# Patient Record
Sex: Female | Born: 1940
Health system: Southern US, Community
[De-identification: ages and names within clinical notes are randomized; demographics above are authoritative.]

## PROBLEM LIST (undated history)

## (undated) DIAGNOSIS — I10 Essential (primary) hypertension: Secondary | ICD-10-CM

## (undated) DIAGNOSIS — K219 Gastro-esophageal reflux disease without esophagitis: Secondary | ICD-10-CM

## (undated) DIAGNOSIS — R002 Palpitations: Secondary | ICD-10-CM

## (undated) DIAGNOSIS — J449 Chronic obstructive pulmonary disease, unspecified: Secondary | ICD-10-CM

## (undated) DIAGNOSIS — I447 Left bundle-branch block, unspecified: Secondary | ICD-10-CM

## (undated) HISTORY — DX: Left bundle-branch block, unspecified: I44.7

## (undated) HISTORY — DX: Chronic obstructive pulmonary disease, unspecified: J44.9

## (undated) HISTORY — DX: Gastro-esophageal reflux disease without esophagitis: K21.9

## (undated) HISTORY — PX: CERVICAL SPINE SURGERY: SHX589

## (undated) HISTORY — PX: ABDOMINAL HYSTERECTOMY: SHX81

## (undated) HISTORY — PX: CHOLECYSTECTOMY: SHX55

## (undated) HISTORY — DX: Palpitations: R00.2

## (undated) HISTORY — DX: Essential (primary) hypertension: I10

## (undated) HISTORY — PX: CATARACT EXTRACTION: SUR2

---

## 2008-07-31 ENCOUNTER — Inpatient Hospital Stay (HOSPITAL_COMMUNITY): Admission: RE | Admit: 2008-07-31 | Discharge: 2008-08-01 | Payer: Self-pay | Admitting: Neurosurgery

## 2011-04-14 NOTE — Op Note (Signed)
NAMESHIRLEYANN, Dorothy Bryant                  ACCOUNT NO.:  [id number]   MEDICAL RECORD NO.:  [id number]          PATIENT TYPE:  INP   LOCATION:  3537                         FACILITY:  [facility]   PHYSICIAN:  Danae Orleans. Venetia Maxon, M.D.  DATE OF BIRTH:  08-04-1941   DATE OF PROCEDURE:  07/31/2008  DATE OF DISCHARGE:                               OPERATIVE REPORT   PREOPERATIVE DIAGNOSES:  1. Herniated cervical disk with spondylosis.  2. Degenerative disk disease.  3. Cervical radiculopathy at C6-7.   POSTOPERATIVE DIAGNOSES:  1. Herniated cervical disk with spondylosis.  2. Degenerative disk disease.  3. Cervical radiculopathy at C6-7.   PROCEDURES:  Anterior cervical decompression and fusion C6-7 with  allograft bone wedge and morselized bone autograft in the anterior  cervical plate.   SURGEON:  Danae Orleans. Venetia Maxon, MD   ASSISTANTS:  1. Georgiann Cocker, RN  2. Cristi Loron, MD   ANESTHESIA:  General endotracheal.   ESTIMATED BLOOD LOSS:  Minimal.   COMPLICATIONS:  None.   DISPOSITION:  Recovery.   INDICATIONS:  Dorothy Bryant is a 70 year old woman with severe bilateral  upper extremity, right greater than left pain with significant  spondylitic foraminal stenosis at C6-7.  It was elected to take her to  surgery for anterior cervical decompression and fusion at this affected  level.   PROCEDURE:  Dorothy Bryant was brought to the operating room.  Following  satisfactory and uncomplicated induction of general endotracheal  anesthesia and placement of intravenous lines, the patient was placed in  a supine position on the operating table.  Her neck was placed in slight  extension.  She was placed in 5 pounds of Halter traction.  Her anterior  neck was then prepped and draped in usual sterile fashion.  Area of  planned incision was infiltrated with 0.25% Marcaine and 0.5% lidocaine  with 1:200,000 epinephrine.  Incision was made from the midline to the  anterior border of sternocleidomastoid  muscle on the left side of  midline just below the carotid tubercle.  Incision was carried sharply  through the platysmal layer.  Subplatysmal dissection was performed  exposing the anterior border of sternocleidomastoid muscle using blunt  dissection.  The carotid sheath was kept lateral, trachea and esophagus  kept medial exposing the anterior cervical spine.  The bent spinal  needle was placed at C5-6 and C6-7 levels and this was confirmed on  intraoperative x-ray.  Subsequently, the longus colli muscles were taken  down from the anterior cervical spine using electrocautery and Key  elevator and still retained input.  Shadow-Line retractors were placed  at the C6-7 level.  Interspace was incised and disk material was  removed.  Disk was highly degenerated.  Interspace was cleared of  residual disk material and osteophytes were removed and saved for later  use with bone grafting.  Subsequently, the distraction pins were placed  at C6 and C7 using general distraction and interspace was opened.  The  microscope was brought in the field and under high-power microscopic  visualization, the end plates were decorticated with high-speed drill  and large uncinate spurs were drilled down.  The right C7 nerve was  decompressed widely, as was the central spinal cord dura and  subsequently the left C7 nerve root was also decompressed.  Hemostasis  was assured and after trial sizing, a 7 mm allograft bone wedge was  selected, packed with morcellized bone autograft preserved from the  drilling of the endplates and from osteophyte removal, and inserted in  the interspace and countersunk appropriately.  A 14-mm Trestle anterior  cervical plate was fixed in the anterior cervical spine with 40-mm  variable angle screws two at C6 and two at C7.  All screws had excellent  purchase.  Interlocking mechanisms were engaged.  Prior to placing the  anterior plate, the traction weight was removed.  Hemostasis  was  assured.  Soft tissues were inspected and found to be in good repair.  Final x-ray demonstrated the superior aspect of the constructed C6  level.  The platysmal layer was closed with 3-0 Vicryl sutures and the  skin edges were approximated with 3-0 Vicryl subcuticular stitches, and  the wound was dressed with Dermabond.  The patient was extubated in the  operating room and taken to the recovery in stable and satisfactory  condition, having tolerated the operation well.  Counts were correct at  the end of the case.      Danae Orleans. Venetia Maxon, M.D.  Electronically Signed     JDS/MEDQ  D:  07/31/2008  T:  08/01/2008  Job:  [id number]

## 2011-06-02 ENCOUNTER — Other Ambulatory Visit (HOSPITAL_COMMUNITY): Payer: Self-pay | Admitting: Pulmonary Disease

## 2011-06-02 ENCOUNTER — Ambulatory Visit (HOSPITAL_COMMUNITY)
Admission: RE | Admit: 2011-06-02 | Discharge: 2011-06-02 | Disposition: A | Discharge status: Home / Self Care | Payer: Medicare Other | Source: Ambulatory Visit | Attending: Pulmonary Disease | Admitting: Pulmonary Disease

## 2011-06-02 DIAGNOSIS — R05 Cough: Secondary | ICD-10-CM

## 2011-06-02 DIAGNOSIS — R0609 Other forms of dyspnea: Secondary | ICD-10-CM | POA: Insufficient documentation

## 2011-06-02 DIAGNOSIS — R0989 Other specified symptoms and signs involving the circulatory and respiratory systems: Secondary | ICD-10-CM | POA: Insufficient documentation

## 2011-06-02 DIAGNOSIS — R059 Cough, unspecified: Secondary | ICD-10-CM | POA: Insufficient documentation

## 2011-06-02 LAB — BLOOD GAS, ARTERIAL
Acid-Base Excess: 2 mmol/L (ref 0.0–2.0)
FIO2: 0.21 %
O2 Saturation: 95.9 %
Patient temperature: 37

## 2011-06-27 ENCOUNTER — Inpatient Hospital Stay (HOSPITAL_COMMUNITY)
Admission: AD | Admit: 2011-06-27 | Discharge: 2011-06-30 | DRG: 287 | Disposition: A | Discharge status: Home / Self Care | Payer: Medicare Other | Source: Other Acute Inpatient Hospital | Attending: Cardiovascular Disease | Admitting: Cardiovascular Disease

## 2011-06-27 DIAGNOSIS — J4489 Other specified chronic obstructive pulmonary disease: Secondary | ICD-10-CM | POA: Diagnosis present

## 2011-06-27 DIAGNOSIS — R002 Palpitations: Secondary | ICD-10-CM

## 2011-06-27 DIAGNOSIS — I447 Left bundle-branch block, unspecified: Secondary | ICD-10-CM | POA: Diagnosis present

## 2011-06-27 DIAGNOSIS — Z87891 Personal history of nicotine dependence: Secondary | ICD-10-CM

## 2011-06-27 DIAGNOSIS — K219 Gastro-esophageal reflux disease without esophagitis: Secondary | ICD-10-CM | POA: Diagnosis present

## 2011-06-27 DIAGNOSIS — Z8249 Family history of ischemic heart disease and other diseases of the circulatory system: Secondary | ICD-10-CM

## 2011-06-27 DIAGNOSIS — J449 Chronic obstructive pulmonary disease, unspecified: Secondary | ICD-10-CM | POA: Diagnosis present

## 2011-06-27 DIAGNOSIS — Z79899 Other long term (current) drug therapy: Secondary | ICD-10-CM

## 2011-06-27 DIAGNOSIS — Z23 Encounter for immunization: Secondary | ICD-10-CM

## 2011-06-27 DIAGNOSIS — R0789 Other chest pain: Principal | ICD-10-CM | POA: Diagnosis present

## 2011-06-27 DIAGNOSIS — I517 Cardiomegaly: Secondary | ICD-10-CM

## 2011-06-27 DIAGNOSIS — I10 Essential (primary) hypertension: Secondary | ICD-10-CM | POA: Diagnosis present

## 2011-06-27 LAB — BASIC METABOLIC PANEL WITH GFR
BUN: 16 mg/dL (ref 6–23)
CO2: 27 meq/L (ref 19–32)
Calcium: 9 mg/dL (ref 8.4–10.5)
Chloride: 103 meq/L (ref 96–112)
Creatinine, Ser: 0.93 mg/dL (ref 0.50–1.10)
GFR calc Af Amer: >60 mL/min
GFR calc non Af Amer: 60 mL/min — ABNORMAL LOW
Glucose, Bld: 96 mg/dL (ref 70–99)
Potassium: 4.7 meq/L (ref 3.5–5.1)
Sodium: 141 meq/L (ref 135–145)

## 2011-06-27 LAB — URINE MICROSCOPIC-ADD ON

## 2011-06-27 LAB — URINALYSIS, ROUTINE W REFLEX MICROSCOPIC
Bilirubin Urine: NEGATIVE
Glucose, UA: NEGATIVE mg/dL
Hgb urine dipstick: NEGATIVE
Protein, ur: NEGATIVE mg/dL

## 2011-06-27 LAB — CARDIAC PANEL(CRET KIN+CKTOT+MB+TROPI)
CK, MB: 10.6 ng/mL (ref 0.3–4.0)
CK, MB: 4.3 ng/mL — ABNORMAL HIGH (ref 0.3–4.0)
CK, MB: 3.8 ng/mL (ref 0.3–4.0)
Relative Index: 2.4 (ref 0.0–2.5)
Relative Index: 2.3 (ref 0.0–2.5)
Total CK: 538 U/L — ABNORMAL HIGH (ref 7–177)
Total CK: 177 U/L (ref 7–177)
Total CK: 165 U/L (ref 7–177)
Troponin I: <0.3 ng/mL
Troponin I: <0.3 ng/mL

## 2011-06-27 LAB — MAGNESIUM: Magnesium: 2.2 mg/dL (ref 1.5–2.5)

## 2011-06-27 LAB — TSH: TSH: 5.528 u[IU]/mL — ABNORMAL HIGH (ref 0.350–4.500)

## 2011-06-28 DIAGNOSIS — I10 Essential (primary) hypertension: Secondary | ICD-10-CM

## 2011-06-28 DIAGNOSIS — R079 Chest pain, unspecified: Secondary | ICD-10-CM

## 2011-06-29 LAB — CBC
MCH: 28.8 pg (ref 26.0–34.0)
MCHC: 32.4 g/dL (ref 30.0–36.0)
Platelets: 259 10*3/uL (ref 150–400)
RDW: 14.2 % (ref 11.5–15.5)

## 2011-06-29 LAB — BASIC METABOLIC PANEL
BUN: 19 mg/dL (ref 6–23)
Creatinine, Ser: 0.86 mg/dL (ref 0.50–1.10)
GFR calc Af Amer: >60 mL/min (ref >60)
GFR calc non Af Amer: >60 mL/min (ref >60)

## 2011-06-29 LAB — PROTIME-INR: Prothrombin Time: 12.7 seconds (ref 11.6–15.2)

## 2011-06-30 HISTORY — PX: CARDIAC CATHETERIZATION: SHX172

## 2011-07-07 ENCOUNTER — Encounter (INDEPENDENT_AMBULATORY_CARE_PROVIDER_SITE_OTHER): Payer: Medicare Other

## 2011-07-07 DIAGNOSIS — R002 Palpitations: Secondary | ICD-10-CM

## 2011-07-09 ENCOUNTER — Encounter: Payer: Medicare Other | Admitting: Physician Assistant

## 2011-07-15 NOTE — Cardiovascular Report (Signed)
  [name], Dorothy Bryant NO.:  [id number]  MEDICAL RECORD NO.:  [id number]  LOCATION:  3701                         FACILITY:  [facility]  PHYSICIAN:  Vesta Mixer, M.D. DATE OF BIRTH:  06-Jul-1941  DATE OF PROCEDURE:  06/29/2011 DATE OF DISCHARGE:                           CARDIAC CATHETERIZATION   Dorothy Bryant is a 70 year old female with a history of chest pain.  She is admitted the other day.  She has an intermittent left bundle-branch block.  She was referred for cardiac catheterization for further evaluation.  PROCEDURES:  Left heart catheterization with coronary angiography.  The right femoral artery was easily cannulated using a modified Seldinger technique.  Angiography of the femoral artery revealed that the stick is just above the bifurcation and is not suitable for Angio-Seal.  HEMODYNAMICS:  The left ventricular apex is 161/15.  Left ventricular pace is 147/10.  The aortic blood pressure is 153/62.  ANGIOGRAPHY:  Left main.  The left main is fairly short.  The Judkins left 4 catheter selectively engaged to left circumflex artery.  It was then pulled back and injections were made of the left anterior descending artery.  The left main is smooth and normal.  The left anterior descending artery is smooth and normal.  There is a large diagonal branch which is normal.  The left circumflex artery is a normal vessel.  The first and second obtuse marginal arteries are fairly small, but are normal.  The right coronary artery is smooth and normal.  The posterior descending artery and the posterolateral segment arteries are normal.  The left ventriculogram was performed in a 30 RAO position.  It reveals normal left ventricular systolic function with an ejection fraction of around 60%-65%.  During the injection, we were able to visualize a mid left ventricular outflow track obstruction.  Following the case, the Judkins right 4 catheter was placed down  into the ventricle and we did a pullback from the apex back to the base.  We are able to demonstrate a grade of around 10 mmHg.  I could not tell whether the abnormal left ventricular contraction pattern was due to the catheter and/or dye injection, but it appears to be real.  Further assessment can be made by echo.  COMPLICATIONS:  None.  CONCLUSION: 1. Smooth and normal coronary arteries. 2. Normal left ventricular systolic function.  She appears to have a     mild left ventricular outflow track obstruction.  We may want to     try treating her with some low-dose beta-blocker.  The patient received Lovenox 40 mg subcu at 10:30 a.m.  I discussed that with the pharmacist.  They recommended sheath pull at 5 p.m. tonight. We will await and pull at 5 p.m. and she will have a 6-hour bedrest.  We will be able to discharge her tomorrow.     Vesta Mixer, M.D.     PJN/MEDQ  D:  06/29/2011  T:  06/30/2011  Job:  [id number]  cc:   Noralyn Pick. Eden Emms, MD, [facility]  Electronically Signed by Kristeen Miss M.D. on 07/15/2011 05:35:42 PM

## 2011-07-22 ENCOUNTER — Encounter: Payer: Self-pay | Admitting: Physician Assistant

## 2011-07-23 ENCOUNTER — Ambulatory Visit (INDEPENDENT_AMBULATORY_CARE_PROVIDER_SITE_OTHER): Payer: Medicare Other | Admitting: Physician Assistant

## 2011-07-23 ENCOUNTER — Encounter: Payer: Self-pay | Admitting: Physician Assistant

## 2011-07-23 VITALS — BP 144/66 | HR 69 | Ht 60.0 in | Wt 147.0 lb

## 2011-07-23 DIAGNOSIS — R7989 Other specified abnormal findings of blood chemistry: Secondary | ICD-10-CM | POA: Insufficient documentation

## 2011-07-23 DIAGNOSIS — R079 Chest pain, unspecified: Secondary | ICD-10-CM | POA: Insufficient documentation

## 2011-07-23 DIAGNOSIS — R002 Palpitations: Secondary | ICD-10-CM

## 2011-07-23 DIAGNOSIS — R6889 Other general symptoms and signs: Secondary | ICD-10-CM

## 2011-07-23 DIAGNOSIS — I10 Essential (primary) hypertension: Secondary | ICD-10-CM

## 2011-07-23 NOTE — Assessment & Plan Note (Signed)
She sees her primary care provider soon.  I have recommended that she have her TSH repeated with him.

## 2011-07-23 NOTE — Discharge Summary (Signed)
NAMEMIKELL, CAMP NO.:  [id number]  MEDICAL RECORD NO.:  [id number]  LOCATION:  3701                         FACILITY:  [facility]  PHYSICIAN:  Hillis Range, MD       DATE OF BIRTH:  [birth year]  DATE OF ADMISSION:  06/27/2011 DATE OF DISCHARGE:  06/30/2011                              DISCHARGE SUMMARY   PRIMARY CARDIOLOGIST:  Hillis Range, MD  DISCHARGE DIAGNOSIS:  Chest pain without objective evidence of ischemia.  SECONDARY DIAGNOSES: 1. Normal coronary arteries by catheterization this admission. 2. Chronic obstructive pulmonary disease. 3. Hypertension. 4. Gastroesophageal reflux disease. 5. Palpitations. 6. Remote tobacco abuse. 7. Left ventricular outflow tract gradient of approximately 40 mmHg. 8. Intermittent left bundle-branch block.  ALLERGIES:  No known drug allergies.  PROCEDURES: 1. Left heart catheterization performed on June 29, 2011 showing     normal coronary arteries and an EF of 60-65%.  There was a mid left     ventricular outflow tract obstruction with a gradient of 14 mmHg. 2. 2-D echocardiogram, June 27, 2011:  EF 55-60% without regional wall     motion abnormalities.  Grade 2 diastolic dysfunction.  Mild LVH.     Normal RV systolic function.  PASP 29 mmHg.  HISTORY OF PRESENT ILLNESS:  This is a 70 year old female without prior history of coronary artery disease who presented to [facility] ED on June 27, 2011 following an episode of substernal chest discomfort associated with mild dizziness and tachy palpitations that occurred after getting out of the shower.  Her blood pressure cuff at home showed a heart rate of 170 beats per minute with a systolic pressure of 200 which prompted her ER visit.  In the ED, she was in sinus rhythm without any acute ST-T changes and point-of-care cardiac markers were negative. She was admitted for evaluation.  HOSPITAL COURSE:  On admission, the patient reported that she had a similar episode  that occurred earlier this year for which she was evaluated at [facility] in [place] with a stress test that was apparently negative.  Secondary to recurrence of symptoms, a decision was made to pursue left heart cardiac catheterization which was performed on June 29, 2011 showing normal coronary arteries.  It was noted on ventriculography that the patient had a mid left ventricular outflow tract obstruction.  Pressure measurement of this area showed a 14 mm gradient.  She was placed on beta-blocker therapy post catheterization and this has since been titrated.  She has had no recurrence of palpitations or tachyarrhythmias on the monitor.  Of note, the patient has been noted to have intermittent left bundle-branch block which was present on admission.  We plan to discharge her home today in good condition on Toprol therapy with plan for a 21-day monitor and follow up in our office in approximately 3 weeks.  DISCHARGE LABORATORY DATA:  Hemoglobin 11.3, hematocrit 34.9, WBC 8.7, and platelets 259.  INR 0.93.  Sodium 140, potassium 3.9, chloride 103, CO2 30, BUN 19, creatinine 0.86, glucose 104, calcium 9.0, and magnesium 2.2.  CK 165, MB 3.8, and troponin-I less 0.30.  TSH 5.528.  DISPOSITION:  The patient will  be discharged home today in good condition.  FOLLOWUP PLANS AND APPOINTMENTS:  We will contact the patient within the next few days to arrange for a 21-day event monitor.  The patient will follow up with Tereso Newcomer, PA on July 23, 2011 at 12:00 p.m.  DISCHARGE MEDICATIONS: 1. Aspirin 81 mg daily. 2. Toprol-XL 25 mg daily. 3. Aleve 220 mg 2 tablets daily p.r.n. 4. Benicar HCT 20/12.5 mg daily. 5. Ipratropium nebulizer 0.5 mg b.i.d. 6. Pepcid OTC 1 tablet daily p.r.n. 7. Symbicort 160/4.5 mcg 2 puffs b.i.d.  OUTSTANDING LABORATORY STUDIES:  Follow up 21-day event monitor.  DURATION OF DISCHARGE ENCOUNTER:  40 minutes including physician time.     Nicolasa Ducking,  ANP   ______________________________ Hillis Range, MD    CB/MEDQ  D:  06/30/2011  T:  07/01/2011  Job:  [id number]  Electronically Signed by Nicolasa Ducking ANP on 07/10/2011 03:31:08 PM Electronically Signed by Hillis Range MD on 07/23/2011 09:42:45 AM

## 2011-07-23 NOTE — Progress Notes (Signed)
History of Present Illness: Primary Electrophysiologist:  Dr. Hillis Range  PCP: Dr. Fara Chute  Dorothy Bryant is a 70 y.o. female who presents for post hospital follow up.  She has a history of COPD, hypertension and GERD.  She presented to [facility] 7/28-7/31 with complaints of chest pain, dizziness and palpitations.  She recorded her heart rate at 170 on her blood pressure machine.  She has had a couple similar episodes earlier this year.  For one episode, she was treated at [facility].  She had a negative stress test.  At [facility], cardiac enzymes were negative.  She had intermittent left bundle branch block on her monitor.  She underwent cardiac catheterization 7/31 that demonstrated normal coronary arteries and normal LV function.  There was a question of whether or not she had a mid LVOT obstruction at 10 mm of mercury.  Echocardiogram demonstrated mild LVH, EF 55-60% and grade 2 diastolic dysfunction.  She was placed on a beta blocker.  Palpation in that monitor was arranged.  I reviewed the strips today.  So far, this has demonstrated normal sinus rhythm.  Labs: Hemoglobin 11.3, potassium 3.9, creatinine 0.86, TSH 5.528.  Chest x-ray demonstrated emphysematous changes without acute changes.  The patient denies chest pain, syncope, orthopnea, PND or significant pedal edema.  She has chronic dyspnea with exertion related to her COPD.  She describes class II-IIb symptoms.  She has not had any further palpitations.  Past Medical History  Diagnosis Date  . Chest pain     Cardiac catheterization 7/12: Normal coronary arteries, EF 60-65%, question mid LVOT obstruction with a 10 mm mercury gradient;   echo 7/12: Mild LVH, EF 55-60%, grade 2 diastolic dysfunction, mild LAE, PAS P. 29  . COPD (chronic obstructive pulmonary disease)   . Hypertension   . GERD (gastroesophageal reflux disease)   . Heart palpitations     Event monitor 8/12  . LBBB (left bundle branch  block)     Current Outpatient Prescriptions  Medication Sig Dispense Refill  . albuterol (PROVENTIL) (2.5 MG/3ML) 0.083% nebulizer solution Take 2.5 mg by nebulization 2 (two) times daily.        Marland Kitchen ALPRAZolam (XANAX) 0.5 MG tablet Take 0.5 mg by mouth at bedtime as needed.        Marland Kitchen aspirin 81 MG tablet Take 81 mg by mouth daily.        . budesonide-formoterol (SYMBICORT) 160-4.5 MCG/ACT inhaler Inhale 2 puffs into the lungs 2 (two) times daily.        . citalopram (CELEXA) 10 MG tablet Take 10 mg by mouth daily.        . famotidine (PEPCID) 10 MG tablet Take 10 mg by mouth at bedtime as needed.        . IPRATROPIUM BROMIDE IN Inhale into the lungs as directed.        Marland Kitchen losartan-hydrochlorothiazide (HYZAAR) 100-12.5 MG per tablet Take 1 tablet by mouth daily.        . metoprolol succinate (TOPROL-XL) 25 MG 24 hr tablet Take 25 mg by mouth daily.        . naproxen sodium (ANAPROX) 220 MG tablet Take 220 mg by mouth as needed.          Allergies: No Known Allergies  Vital Signs: BP 144/66  Pulse 69  Ht 5' (1.524 m)  Wt 147 lb (66.679 kg)  BMI 28.71 kg/m2  PHYSICAL EXAM: Well nourished, well developed, in  no acute distress HEENT: normal Neck: no JVD Cardiac:  normal S1, S2; RRR; no murmur Lungs:  Decreased breath sounds bilaterally, no wheezing, rhonchi or rales Abd: soft, nontender, no hepatomegaly Ext: no edema; RFA site without hematoma or bruit Skin: warm and dry Neuro:  CNs 2-12 intact, no focal abnormalities noted  EKG:  Sinus rhythm, heart rate 69, left bundle branch block  ASSESSMENT AND PLAN:

## 2011-07-23 NOTE — Assessment & Plan Note (Signed)
Continue beta blocker.  She will continue to wear her monitor until it is completed.  As noted, thus far, she has just demonstrated normal sinus rhythm.  Followup with Dr. Johney Frame in 2 months.

## 2011-07-23 NOTE — Assessment & Plan Note (Signed)
Controlled.  Continue current therapy.  

## 2011-07-23 NOTE — Assessment & Plan Note (Signed)
No recurrence.  As noted, she had normal coronary arteries on cardiac catheterization.

## 2011-07-23 NOTE — Patient Instructions (Signed)
Your physician recommends that you schedule a follow-up appointment in: 2 months with Dr. Johney Frame as per Tereso Newcomer, PA-C  Please have your thyroid re-checked when you see your Primary Care Physician as per Tereso Newcomer, PA-C

## 2011-07-27 ENCOUNTER — Other Ambulatory Visit: Payer: Self-pay | Admitting: Cardiovascular Disease

## 2011-07-27 MED ORDER — METOPROLOL SUCCINATE ER 25 MG PO TB24: 25.0000 mg | ORAL_TABLET | Freq: Every day | ORAL | Status: DC

## 2011-08-23 NOTE — H&P (Signed)
NAMEMARYCATHERINE, Dorothy Bryant NO.:  [id number]  MEDICAL RECORD NO.:  [id number]  LOCATION:  3701                         FACILITY:  [facility]  PHYSICIAN:  Henderson Cloud, MD     DATE OF BIRTH:  06-25-1941  DATE OF ADMISSION:  06/27/2011 DATE OF DISCHARGE:                             HISTORY & PHYSICAL   CHIEF COMPLAINT:  Palpitations.  HISTORY OF PRESENT ILLNESS:  The patient is a 70 year old white female with a past medical history significant for COPD, hypertension, GERD who is presenting with palpitations and chest discomfort after getting out of the shower today.  The patient states that she was in her normal state of health until she exited the shower.  At that time, she felt substernal chest discomfort, some mild dizziness, and sensation that her heart was racing.  Her automated blood pressure monitoring device indicated her heart rate was greater than 170 beats per minute and her systolic blood pressure was greater than 200.  The patient states she had a similar episode in May of this year, also after exiting the shower.  At that time, she was worked up at [facility] with a stress test which per the family's report was negative.  These are the only 2 such episodes that she has experienced.  The patient's symptoms lasted for a couple of hours until she arrived in the emergency room.  Per the ED report, her vital signs were stable upon initial evaluation.  PAST MEDICAL HISTORY:  As above in the HPI.  SOCIAL HISTORY:  Has a history of tobacco, but quit 3 years ago.  No alcohol.  FAMILY HISTORY:  Positive for coronary artery disease but not premature coronary artery disease.  ALLERGIES:  No known drug allergies.  MEDICATIONS: 1. Symbicort 160/4.5 daily. 2. Combivent p.r.n. 3. Benicar HCT 20/12.5 mg daily. 4. Pepcid 20 mg daily.  REVIEW OF SYSTEMS:  As in HPI.  All other systems were reviewed and are negative.  PHYSICAL EXAMINATION:  VITAL SIGNS:  The  patient is afebrile, blood pressure is 143/73, pulse is 88, satting 95% on 2 L. GENERAL:  No acute stress. HEENT: Normocephalic, atraumatic. NECK:  Supple.  There is no JVD.  No carotid bruits. HEART:  Regular rate and rhythm without murmur or gallop. LUNGS:  Clear bilaterally. ABDOMEN:  Soft, nontender, nondistended. EXTREMITIES:  Without edema. SKIN:  Warm and dry. PSYCHIATRIC:  The patient is appropriate. MUSCULOSKELETAL:  5/5 bilateral upper and lower extremity strength. NEURO:  Grossly nonfocal.  LABORATORY DATA:  Sodium 141, potassium 3.4, chloride 104, CO2 30, BUN 17, creatinine 0.97, glucose 90, white count 11, hemoglobin 13, hematocrit 34, platelet count 301, D-dimer is 0.37, CK 273, CK-MB 4.0, troponin less than 0.01.  EKG; normal sinus rhythm and left bundle- branch block, poor R-wave progression.  ASSESSMENT:  The patient's symptoms are most consistent with a tachyarrhythmia.  It is encouraging that she had a negative stress test at [facility] in May 2012, although the official report along with prior EKG would be helpful.  If this is a tachyarrhythmia, EAT certainly would be likely considering her history of chronic obstructive pulmonary disease.  PLAN:  The  patient is admitted to telemetry and will be ruled out for myocardial infarction.  We will check a TSH and a UA.  Her potassium will be replaced.  We will check a transthoracic echocardiogram to assure normal heart structure and function.  I will start her on Cardizem 180 mg daily for improved blood pressure control and potential improvements in any tachyarrhythmias she may be experiencing.  If her current EKG abnormalities are old, there may be no need for further ischemia evaluation if she had one earlier this year.  However, if the poor R-wave progression and the intraventricular conduction delay is new, a left heart catheterization could be considered for definitive evaluation of the coronary anatomy.  Other home  medications will be continued.     Henderson Cloud, MD     SGA/MEDQ  D:  06/27/2011  T:  06/27/2011  Job:  [id number]  Electronically Signed by Raynelle Bring MD on 08/23/2011 09:54:40 AM

## 2011-09-18 ENCOUNTER — Encounter: Payer: Self-pay | Admitting: *Deleted

## 2011-09-23 ENCOUNTER — Ambulatory Visit (INDEPENDENT_AMBULATORY_CARE_PROVIDER_SITE_OTHER): Payer: Medicare Other | Admitting: Internal Medicine

## 2011-09-23 ENCOUNTER — Encounter: Payer: Self-pay | Admitting: Internal Medicine

## 2011-09-23 DIAGNOSIS — R079 Chest pain, unspecified: Secondary | ICD-10-CM

## 2011-09-23 DIAGNOSIS — I10 Essential (primary) hypertension: Secondary | ICD-10-CM

## 2011-09-23 DIAGNOSIS — R002 Palpitations: Secondary | ICD-10-CM

## 2011-09-23 NOTE — Progress Notes (Signed)
The patient presents today for routine electrophysiology followup.  Since last being seen in our clinic, the patient reports doing very well.  She remains active, without further difficulty.  She has stable SOB with moderate activity.  Her palpitations have resolved  Today, she denies symptoms of chest pain,orthopnea, PND, lower extremity edema, dizziness, presyncope, syncope, or neurologic sequela.  The patient feels that she is tolerating medications without difficulties and is otherwise without complaint today.   Past Medical History  Diagnosis Date  . Chest pain     Cardiac catheterization 7/12: Normal coronary arteries, EF 60-65%, question mid LVOT obstruction with a 10 mm mercury gradient;   echo 7/12: Mild LVH, EF 55-60%, grade 2 diastolic dysfunction, mild LAE, PAS P. 29  . COPD (chronic obstructive pulmonary disease)   . Hypertension   . GERD (gastroesophageal reflux disease)   . Heart palpitations     Event monitor 8/12  . LBBB (left bundle branch block)    Past Surgical History  Procedure Date  . Cardiac catheterization 06/30/2011    Est. EF of 60-65% with normal coronary arteries --     Current Outpatient Prescriptions  Medication Sig Dispense Refill  . albuterol (PROVENTIL) (2.5 MG/3ML) 0.083% nebulizer solution Take 2.5 mg by nebulization 2 (two) times daily.        Marland Kitchen ALPRAZolam (XANAX) 0.5 MG tablet Take 0.5 mg by mouth at bedtime as needed.        Marland Kitchen aspirin 81 MG tablet Take 81 mg by mouth daily.        . budesonide-formoterol (SYMBICORT) 160-4.5 MCG/ACT inhaler Inhale 2 puffs into the lungs 2 (two) times daily.        . citalopram (CELEXA) 10 MG tablet Take 10 mg by mouth daily.        . famotidine (PEPCID) 10 MG tablet Take 10 mg by mouth at bedtime as needed.        Marland Kitchen losartan-hydrochlorothiazide (HYZAAR) 100-12.5 MG per tablet Take 1 tablet by mouth daily.        . metoprolol succinate (TOPROL-XL) 25 MG 24 hr tablet Take 1 tablet (25 mg total) by mouth daily.  30  tablet  12  . tiotropium (SPIRIVA) 18 MCG inhalation capsule Place 18 mcg into inhaler and inhale daily.          No Known Allergies  History   Social History  . Marital Status: Divorced    Spouse Name: N/A    Number of Children: N/A  . Years of Education: N/A   Occupational History  . Not on file.   Social History Main Topics  . Smoking status: Former Smoker -- 1.0 packs/day for 50 years    Types: Cigarettes    Quit date: 09/17/2008  . Smokeless tobacco: Not on file  . Alcohol Use: No  . Drug Use: Not on file  . Sexually Active: Not on file   Other Topics Concern  . Not on file   Social History Narrative  . No narrative on file    Family History  Problem Relation Age of Onset  . Acute lymphoblastic leukemia Maternal Grandfather   . Coronary artery disease      Positive Family History  . Heart disease      Positive Family History   Physical Exam: Filed Vitals:   09/23/11 1353  BP: 130/80  Pulse: 71  Height: 5' (1.524 m)  Weight: 145 lb 1.9 oz (65.826 kg)    GEN- The patient is well  appearing, alert and oriented x 3 today.   Head- normocephalic, atraumatic Eyes-  Sclera clear, conjunctiva pink Ears- hearing intact Oropharynx- clear Neck- supple,  Lungs- Clear to ausculation bilaterally, normal work of breathing Heart- Regular rate and rhythm, no murmurs, rubs or gallops, PMI not laterally displaced GI- soft, NT, ND, + BS Extremities- no clubbing, cyanosis, or edema   Assessment and Plan:

## 2011-09-23 NOTE — Patient Instructions (Signed)
Your physician recommends that you schedule a follow-up appointment as needed  

## 2011-09-23 NOTE — Assessment & Plan Note (Signed)
Recent event monitor reveals no arrhythmias Symptoms have resolved Continue low dose toprol

## 2011-09-23 NOTE — Assessment & Plan Note (Signed)
Stable No change required today  

## 2011-09-23 NOTE — Assessment & Plan Note (Signed)
Resolved Recent normal cath

## 2012-03-23 DIAGNOSIS — J449 Chronic obstructive pulmonary disease, unspecified: Secondary | ICD-10-CM | POA: Diagnosis not present

## 2012-03-23 DIAGNOSIS — I10 Essential (primary) hypertension: Secondary | ICD-10-CM | POA: Diagnosis not present

## 2012-04-19 DIAGNOSIS — I10 Essential (primary) hypertension: Secondary | ICD-10-CM | POA: Diagnosis not present

## 2012-04-26 DIAGNOSIS — I10 Essential (primary) hypertension: Secondary | ICD-10-CM | POA: Diagnosis not present

## 2012-04-26 DIAGNOSIS — F411 Generalized anxiety disorder: Secondary | ICD-10-CM | POA: Diagnosis not present

## 2012-04-26 DIAGNOSIS — J438 Other emphysema: Secondary | ICD-10-CM | POA: Diagnosis not present

## 2012-04-26 DIAGNOSIS — R7309 Other abnormal glucose: Secondary | ICD-10-CM | POA: Diagnosis not present

## 2012-04-26 DIAGNOSIS — E7800 Pure hypercholesterolemia, unspecified: Secondary | ICD-10-CM | POA: Diagnosis not present

## 2012-08-03 DIAGNOSIS — R1311 Dysphagia, oral phase: Secondary | ICD-10-CM | POA: Diagnosis not present

## 2012-08-03 DIAGNOSIS — F411 Generalized anxiety disorder: Secondary | ICD-10-CM | POA: Diagnosis not present

## 2012-08-03 DIAGNOSIS — J438 Other emphysema: Secondary | ICD-10-CM | POA: Diagnosis not present

## 2012-08-03 DIAGNOSIS — I10 Essential (primary) hypertension: Secondary | ICD-10-CM | POA: Diagnosis not present

## 2012-08-03 DIAGNOSIS — R42 Dizziness and giddiness: Secondary | ICD-10-CM | POA: Diagnosis not present

## 2012-08-10 DIAGNOSIS — Z23 Encounter for immunization: Secondary | ICD-10-CM | POA: Diagnosis not present

## 2012-08-25 DIAGNOSIS — R1310 Dysphagia, unspecified: Secondary | ICD-10-CM | POA: Diagnosis not present

## 2012-08-26 DIAGNOSIS — J438 Other emphysema: Secondary | ICD-10-CM | POA: Diagnosis not present

## 2012-08-26 DIAGNOSIS — I10 Essential (primary) hypertension: Secondary | ICD-10-CM | POA: Diagnosis not present

## 2012-08-26 DIAGNOSIS — R1310 Dysphagia, unspecified: Secondary | ICD-10-CM | POA: Diagnosis not present

## 2012-08-26 DIAGNOSIS — M129 Arthropathy, unspecified: Secondary | ICD-10-CM | POA: Diagnosis not present

## 2012-08-26 DIAGNOSIS — F411 Generalized anxiety disorder: Secondary | ICD-10-CM | POA: Diagnosis not present

## 2012-08-26 DIAGNOSIS — M109 Gout, unspecified: Secondary | ICD-10-CM | POA: Diagnosis not present

## 2012-08-26 DIAGNOSIS — Z7982 Long term (current) use of aspirin: Secondary | ICD-10-CM | POA: Diagnosis not present

## 2012-08-26 DIAGNOSIS — Z79899 Other long term (current) drug therapy: Secondary | ICD-10-CM | POA: Diagnosis not present

## 2012-08-26 DIAGNOSIS — K2960 Other gastritis without bleeding: Secondary | ICD-10-CM | POA: Diagnosis not present

## 2012-09-29 DIAGNOSIS — I10 Essential (primary) hypertension: Secondary | ICD-10-CM | POA: Diagnosis not present

## 2012-09-29 DIAGNOSIS — J449 Chronic obstructive pulmonary disease, unspecified: Secondary | ICD-10-CM | POA: Diagnosis not present

## 2012-10-19 DIAGNOSIS — I10 Essential (primary) hypertension: Secondary | ICD-10-CM | POA: Diagnosis not present

## 2012-10-19 DIAGNOSIS — E7800 Pure hypercholesterolemia, unspecified: Secondary | ICD-10-CM | POA: Diagnosis not present

## 2012-10-24 DIAGNOSIS — J438 Other emphysema: Secondary | ICD-10-CM | POA: Diagnosis not present

## 2012-10-24 DIAGNOSIS — J209 Acute bronchitis, unspecified: Secondary | ICD-10-CM | POA: Diagnosis not present

## 2012-10-24 DIAGNOSIS — F411 Generalized anxiety disorder: Secondary | ICD-10-CM | POA: Diagnosis not present

## 2012-10-24 DIAGNOSIS — E781 Pure hyperglyceridemia: Secondary | ICD-10-CM | POA: Diagnosis not present

## 2012-10-24 DIAGNOSIS — E7800 Pure hypercholesterolemia, unspecified: Secondary | ICD-10-CM | POA: Diagnosis not present

## 2012-10-24 DIAGNOSIS — R7309 Other abnormal glucose: Secondary | ICD-10-CM | POA: Diagnosis not present

## 2012-10-24 DIAGNOSIS — I10 Essential (primary) hypertension: Secondary | ICD-10-CM | POA: Diagnosis not present

## 2013-02-13 DIAGNOSIS — E7800 Pure hypercholesterolemia, unspecified: Secondary | ICD-10-CM | POA: Diagnosis not present

## 2013-02-16 DIAGNOSIS — E781 Pure hyperglyceridemia: Secondary | ICD-10-CM | POA: Diagnosis not present

## 2013-02-16 DIAGNOSIS — R7309 Other abnormal glucose: Secondary | ICD-10-CM | POA: Diagnosis not present

## 2013-02-22 DIAGNOSIS — L723 Sebaceous cyst: Secondary | ICD-10-CM | POA: Diagnosis not present

## 2013-06-19 DIAGNOSIS — R079 Chest pain, unspecified: Secondary | ICD-10-CM | POA: Diagnosis not present

## 2013-06-19 DIAGNOSIS — R072 Precordial pain: Secondary | ICD-10-CM | POA: Diagnosis not present

## 2013-06-19 DIAGNOSIS — I10 Essential (primary) hypertension: Secondary | ICD-10-CM | POA: Diagnosis not present

## 2013-06-19 DIAGNOSIS — E781 Pure hyperglyceridemia: Secondary | ICD-10-CM | POA: Diagnosis not present

## 2013-06-19 DIAGNOSIS — E7800 Pure hypercholesterolemia, unspecified: Secondary | ICD-10-CM | POA: Diagnosis not present

## 2013-06-19 DIAGNOSIS — R42 Dizziness and giddiness: Secondary | ICD-10-CM | POA: Diagnosis not present

## 2013-06-19 DIAGNOSIS — R5381 Other malaise: Secondary | ICD-10-CM | POA: Diagnosis not present

## 2013-06-19 DIAGNOSIS — F411 Generalized anxiety disorder: Secondary | ICD-10-CM | POA: Diagnosis not present

## 2013-06-20 DIAGNOSIS — R42 Dizziness and giddiness: Secondary | ICD-10-CM | POA: Diagnosis not present

## 2013-06-20 DIAGNOSIS — R29898 Other symptoms and signs involving the musculoskeletal system: Secondary | ICD-10-CM | POA: Diagnosis not present

## 2013-06-22 DIAGNOSIS — R079 Chest pain, unspecified: Secondary | ICD-10-CM

## 2013-06-22 DIAGNOSIS — R42 Dizziness and giddiness: Secondary | ICD-10-CM | POA: Diagnosis not present

## 2013-08-15 DIAGNOSIS — E7800 Pure hypercholesterolemia, unspecified: Secondary | ICD-10-CM | POA: Diagnosis not present

## 2013-08-15 DIAGNOSIS — I10 Essential (primary) hypertension: Secondary | ICD-10-CM | POA: Diagnosis not present

## 2013-08-21 DIAGNOSIS — R7309 Other abnormal glucose: Secondary | ICD-10-CM | POA: Diagnosis not present

## 2013-08-21 DIAGNOSIS — I10 Essential (primary) hypertension: Secondary | ICD-10-CM | POA: Diagnosis not present

## 2013-08-21 DIAGNOSIS — F411 Generalized anxiety disorder: Secondary | ICD-10-CM | POA: Diagnosis not present

## 2013-08-21 DIAGNOSIS — E781 Pure hyperglyceridemia: Secondary | ICD-10-CM | POA: Diagnosis not present

## 2013-08-21 DIAGNOSIS — J438 Other emphysema: Secondary | ICD-10-CM | POA: Diagnosis not present

## 2013-08-21 DIAGNOSIS — Z23 Encounter for immunization: Secondary | ICD-10-CM | POA: Diagnosis not present

## 2013-08-21 DIAGNOSIS — E7800 Pure hypercholesterolemia, unspecified: Secondary | ICD-10-CM | POA: Diagnosis not present

## 2013-10-03 DIAGNOSIS — J449 Chronic obstructive pulmonary disease, unspecified: Secondary | ICD-10-CM | POA: Diagnosis not present

## 2013-10-03 DIAGNOSIS — M25549 Pain in joints of unspecified hand: Secondary | ICD-10-CM | POA: Diagnosis not present

## 2013-10-03 DIAGNOSIS — I10 Essential (primary) hypertension: Secondary | ICD-10-CM | POA: Diagnosis not present

## 2013-11-06 DIAGNOSIS — M5430 Sciatica, unspecified side: Secondary | ICD-10-CM | POA: Diagnosis not present

## 2013-11-28 DIAGNOSIS — I10 Essential (primary) hypertension: Secondary | ICD-10-CM | POA: Diagnosis not present

## 2013-11-28 DIAGNOSIS — R7309 Other abnormal glucose: Secondary | ICD-10-CM | POA: Diagnosis not present

## 2013-11-28 DIAGNOSIS — E7800 Pure hypercholesterolemia, unspecified: Secondary | ICD-10-CM | POA: Diagnosis not present

## 2013-12-06 DIAGNOSIS — K2100 Gastro-esophageal reflux disease with esophagitis, without bleeding: Secondary | ICD-10-CM | POA: Diagnosis not present

## 2013-12-06 DIAGNOSIS — R1319 Other dysphagia: Secondary | ICD-10-CM | POA: Diagnosis not present

## 2013-12-06 DIAGNOSIS — E781 Pure hyperglyceridemia: Secondary | ICD-10-CM | POA: Diagnosis not present

## 2013-12-06 DIAGNOSIS — E7800 Pure hypercholesterolemia, unspecified: Secondary | ICD-10-CM | POA: Diagnosis not present

## 2013-12-06 DIAGNOSIS — I10 Essential (primary) hypertension: Secondary | ICD-10-CM | POA: Diagnosis not present

## 2013-12-06 DIAGNOSIS — F411 Generalized anxiety disorder: Secondary | ICD-10-CM | POA: Diagnosis not present

## 2013-12-06 DIAGNOSIS — R7309 Other abnormal glucose: Secondary | ICD-10-CM | POA: Diagnosis not present

## 2013-12-06 DIAGNOSIS — J438 Other emphysema: Secondary | ICD-10-CM | POA: Diagnosis not present

## 2013-12-11 DIAGNOSIS — M545 Low back pain, unspecified: Secondary | ICD-10-CM | POA: Diagnosis not present

## 2013-12-11 DIAGNOSIS — M5137 Other intervertebral disc degeneration, lumbosacral region: Secondary | ICD-10-CM | POA: Diagnosis not present

## 2013-12-15 DIAGNOSIS — R1310 Dysphagia, unspecified: Secondary | ICD-10-CM | POA: Diagnosis not present

## 2013-12-20 DIAGNOSIS — K2289 Other specified disease of esophagus: Secondary | ICD-10-CM | POA: Diagnosis not present

## 2013-12-20 DIAGNOSIS — M47817 Spondylosis without myelopathy or radiculopathy, lumbosacral region: Secondary | ICD-10-CM | POA: Diagnosis not present

## 2013-12-20 DIAGNOSIS — K228 Other specified diseases of esophagus: Secondary | ICD-10-CM | POA: Diagnosis not present

## 2013-12-20 DIAGNOSIS — M5137 Other intervertebral disc degeneration, lumbosacral region: Secondary | ICD-10-CM | POA: Diagnosis not present

## 2013-12-20 DIAGNOSIS — M5126 Other intervertebral disc displacement, lumbar region: Secondary | ICD-10-CM | POA: Diagnosis not present

## 2013-12-20 DIAGNOSIS — M48061 Spinal stenosis, lumbar region without neurogenic claudication: Secondary | ICD-10-CM | POA: Diagnosis not present

## 2013-12-20 DIAGNOSIS — K449 Diaphragmatic hernia without obstruction or gangrene: Secondary | ICD-10-CM | POA: Diagnosis not present

## 2013-12-20 DIAGNOSIS — M4120 Other idiopathic scoliosis, site unspecified: Secondary | ICD-10-CM | POA: Diagnosis not present

## 2013-12-20 DIAGNOSIS — M545 Low back pain, unspecified: Secondary | ICD-10-CM | POA: Diagnosis not present

## 2013-12-28 DIAGNOSIS — R1310 Dysphagia, unspecified: Secondary | ICD-10-CM | POA: Diagnosis not present

## 2014-02-08 DIAGNOSIS — R1310 Dysphagia, unspecified: Secondary | ICD-10-CM | POA: Diagnosis not present

## 2014-02-09 DIAGNOSIS — M4120 Other idiopathic scoliosis, site unspecified: Secondary | ICD-10-CM | POA: Diagnosis not present

## 2014-02-09 DIAGNOSIS — M47817 Spondylosis without myelopathy or radiculopathy, lumbosacral region: Secondary | ICD-10-CM | POA: Diagnosis not present

## 2014-02-12 DIAGNOSIS — F411 Generalized anxiety disorder: Secondary | ICD-10-CM | POA: Diagnosis not present

## 2014-02-12 DIAGNOSIS — J438 Other emphysema: Secondary | ICD-10-CM | POA: Diagnosis not present

## 2014-02-12 DIAGNOSIS — A048 Other specified bacterial intestinal infections: Secondary | ICD-10-CM | POA: Diagnosis not present

## 2014-02-12 DIAGNOSIS — Z79899 Other long term (current) drug therapy: Secondary | ICD-10-CM | POA: Diagnosis not present

## 2014-02-12 DIAGNOSIS — Z8489 Family history of other specified conditions: Secondary | ICD-10-CM | POA: Diagnosis not present

## 2014-02-12 DIAGNOSIS — R1310 Dysphagia, unspecified: Secondary | ICD-10-CM | POA: Diagnosis not present

## 2014-02-12 DIAGNOSIS — M129 Arthropathy, unspecified: Secondary | ICD-10-CM | POA: Diagnosis not present

## 2014-02-12 DIAGNOSIS — K2990 Gastroduodenitis, unspecified, without bleeding: Secondary | ICD-10-CM | POA: Diagnosis not present

## 2014-02-12 DIAGNOSIS — K219 Gastro-esophageal reflux disease without esophagitis: Secondary | ICD-10-CM | POA: Diagnosis not present

## 2014-02-12 DIAGNOSIS — Z794 Long term (current) use of insulin: Secondary | ICD-10-CM | POA: Diagnosis not present

## 2014-02-12 DIAGNOSIS — Z87891 Personal history of nicotine dependence: Secondary | ICD-10-CM | POA: Diagnosis not present

## 2014-02-12 DIAGNOSIS — M109 Gout, unspecified: Secondary | ICD-10-CM | POA: Diagnosis not present

## 2014-02-12 DIAGNOSIS — K2970 Gastritis, unspecified, without bleeding: Secondary | ICD-10-CM | POA: Diagnosis not present

## 2014-02-12 DIAGNOSIS — I10 Essential (primary) hypertension: Secondary | ICD-10-CM | POA: Diagnosis not present

## 2014-03-07 DIAGNOSIS — J209 Acute bronchitis, unspecified: Secondary | ICD-10-CM | POA: Diagnosis not present

## 2014-03-07 DIAGNOSIS — J0100 Acute maxillary sinusitis, unspecified: Secondary | ICD-10-CM | POA: Diagnosis not present

## 2014-03-12 DIAGNOSIS — M47817 Spondylosis without myelopathy or radiculopathy, lumbosacral region: Secondary | ICD-10-CM | POA: Diagnosis not present

## 2014-03-12 DIAGNOSIS — M5380 Other specified dorsopathies, site unspecified: Secondary | ICD-10-CM | POA: Diagnosis not present

## 2014-03-15 DIAGNOSIS — M5380 Other specified dorsopathies, site unspecified: Secondary | ICD-10-CM | POA: Diagnosis not present

## 2014-03-15 DIAGNOSIS — J449 Chronic obstructive pulmonary disease, unspecified: Secondary | ICD-10-CM | POA: Diagnosis not present

## 2014-03-15 DIAGNOSIS — Z79899 Other long term (current) drug therapy: Secondary | ICD-10-CM | POA: Diagnosis not present

## 2014-03-15 DIAGNOSIS — Z7982 Long term (current) use of aspirin: Secondary | ICD-10-CM | POA: Diagnosis not present

## 2014-03-15 DIAGNOSIS — Z981 Arthrodesis status: Secondary | ICD-10-CM | POA: Diagnosis not present

## 2014-03-15 DIAGNOSIS — M47817 Spondylosis without myelopathy or radiculopathy, lumbosacral region: Secondary | ICD-10-CM | POA: Diagnosis not present

## 2014-03-15 DIAGNOSIS — Z87891 Personal history of nicotine dependence: Secondary | ICD-10-CM | POA: Diagnosis not present

## 2014-03-26 DIAGNOSIS — E7800 Pure hypercholesterolemia, unspecified: Secondary | ICD-10-CM | POA: Diagnosis not present

## 2014-03-26 DIAGNOSIS — K2100 Gastro-esophageal reflux disease with esophagitis, without bleeding: Secondary | ICD-10-CM | POA: Diagnosis not present

## 2014-03-26 DIAGNOSIS — R7309 Other abnormal glucose: Secondary | ICD-10-CM | POA: Diagnosis not present

## 2014-03-26 DIAGNOSIS — I10 Essential (primary) hypertension: Secondary | ICD-10-CM | POA: Diagnosis not present

## 2014-04-02 DIAGNOSIS — R1319 Other dysphagia: Secondary | ICD-10-CM | POA: Diagnosis not present

## 2014-04-02 DIAGNOSIS — E781 Pure hyperglyceridemia: Secondary | ICD-10-CM | POA: Diagnosis not present

## 2014-04-02 DIAGNOSIS — F411 Generalized anxiety disorder: Secondary | ICD-10-CM | POA: Diagnosis not present

## 2014-04-02 DIAGNOSIS — I10 Essential (primary) hypertension: Secondary | ICD-10-CM | POA: Diagnosis not present

## 2014-04-02 DIAGNOSIS — J438 Other emphysema: Secondary | ICD-10-CM | POA: Diagnosis not present

## 2014-04-02 DIAGNOSIS — E7800 Pure hypercholesterolemia, unspecified: Secondary | ICD-10-CM | POA: Diagnosis not present

## 2014-04-02 DIAGNOSIS — R7309 Other abnormal glucose: Secondary | ICD-10-CM | POA: Diagnosis not present

## 2014-04-02 DIAGNOSIS — K2100 Gastro-esophageal reflux disease with esophagitis, without bleeding: Secondary | ICD-10-CM | POA: Diagnosis not present

## 2014-04-03 ENCOUNTER — Other Ambulatory Visit (HOSPITAL_COMMUNITY): Payer: Self-pay | Admitting: Family Medicine

## 2014-04-03 DIAGNOSIS — M810 Age-related osteoporosis without current pathological fracture: Secondary | ICD-10-CM

## 2014-04-04 ENCOUNTER — Ambulatory Visit (HOSPITAL_COMMUNITY)
Admission: RE | Admit: 2014-04-04 | Discharge: 2014-04-04 | Disposition: A | Discharge status: Home / Self Care | Payer: Medicare Other | Source: Ambulatory Visit | Attending: Family Medicine | Admitting: Family Medicine

## 2014-04-04 DIAGNOSIS — Z780 Asymptomatic menopausal state: Secondary | ICD-10-CM | POA: Diagnosis not present

## 2014-04-04 DIAGNOSIS — M810 Age-related osteoporosis without current pathological fracture: Secondary | ICD-10-CM | POA: Diagnosis not present

## 2014-04-11 DIAGNOSIS — M47817 Spondylosis without myelopathy or radiculopathy, lumbosacral region: Secondary | ICD-10-CM | POA: Diagnosis not present

## 2014-04-11 DIAGNOSIS — M2559 Pain in other specified joint: Secondary | ICD-10-CM | POA: Diagnosis not present

## 2014-05-15 DIAGNOSIS — M4120 Other idiopathic scoliosis, site unspecified: Secondary | ICD-10-CM | POA: Diagnosis not present

## 2014-05-15 DIAGNOSIS — M47817 Spondylosis without myelopathy or radiculopathy, lumbosacral region: Secondary | ICD-10-CM | POA: Diagnosis not present

## 2014-05-15 DIAGNOSIS — M810 Age-related osteoporosis without current pathological fracture: Secondary | ICD-10-CM | POA: Diagnosis not present

## 2014-05-24 DIAGNOSIS — M810 Age-related osteoporosis without current pathological fracture: Secondary | ICD-10-CM | POA: Diagnosis not present

## 2014-05-24 DIAGNOSIS — Z7983 Long term (current) use of bisphosphonates: Secondary | ICD-10-CM | POA: Diagnosis not present

## 2014-06-08 DIAGNOSIS — J449 Chronic obstructive pulmonary disease, unspecified: Secondary | ICD-10-CM | POA: Diagnosis not present

## 2014-06-08 DIAGNOSIS — Z87891 Personal history of nicotine dependence: Secondary | ICD-10-CM | POA: Diagnosis not present

## 2014-06-08 DIAGNOSIS — M2559 Pain in other specified joint: Secondary | ICD-10-CM | POA: Diagnosis not present

## 2014-06-08 DIAGNOSIS — Z7982 Long term (current) use of aspirin: Secondary | ICD-10-CM | POA: Diagnosis not present

## 2014-06-08 DIAGNOSIS — M47817 Spondylosis without myelopathy or radiculopathy, lumbosacral region: Secondary | ICD-10-CM | POA: Diagnosis not present

## 2014-06-08 DIAGNOSIS — Z79899 Other long term (current) drug therapy: Secondary | ICD-10-CM | POA: Diagnosis not present

## 2014-06-08 DIAGNOSIS — Z981 Arthrodesis status: Secondary | ICD-10-CM | POA: Diagnosis not present

## 2014-07-09 DIAGNOSIS — R1310 Dysphagia, unspecified: Secondary | ICD-10-CM | POA: Diagnosis not present

## 2014-07-09 DIAGNOSIS — A048 Other specified bacterial intestinal infections: Secondary | ICD-10-CM | POA: Diagnosis not present

## 2014-07-26 DIAGNOSIS — I10 Essential (primary) hypertension: Secondary | ICD-10-CM | POA: Diagnosis not present

## 2014-07-26 DIAGNOSIS — E7800 Pure hypercholesterolemia, unspecified: Secondary | ICD-10-CM | POA: Diagnosis not present

## 2014-07-26 DIAGNOSIS — R7309 Other abnormal glucose: Secondary | ICD-10-CM | POA: Diagnosis not present

## 2014-08-07 DIAGNOSIS — Z23 Encounter for immunization: Secondary | ICD-10-CM | POA: Diagnosis not present

## 2014-08-07 DIAGNOSIS — J438 Other emphysema: Secondary | ICD-10-CM | POA: Diagnosis not present

## 2014-08-07 DIAGNOSIS — M47817 Spondylosis without myelopathy or radiculopathy, lumbosacral region: Secondary | ICD-10-CM | POA: Diagnosis not present

## 2014-08-07 DIAGNOSIS — K2100 Gastro-esophageal reflux disease with esophagitis, without bleeding: Secondary | ICD-10-CM | POA: Diagnosis not present

## 2014-08-07 DIAGNOSIS — M810 Age-related osteoporosis without current pathological fracture: Secondary | ICD-10-CM | POA: Diagnosis not present

## 2014-08-07 DIAGNOSIS — I10 Essential (primary) hypertension: Secondary | ICD-10-CM | POA: Diagnosis not present

## 2014-08-07 DIAGNOSIS — E781 Pure hyperglyceridemia: Secondary | ICD-10-CM | POA: Diagnosis not present

## 2014-08-07 DIAGNOSIS — E7800 Pure hypercholesterolemia, unspecified: Secondary | ICD-10-CM | POA: Diagnosis not present

## 2014-08-07 DIAGNOSIS — R7309 Other abnormal glucose: Secondary | ICD-10-CM | POA: Diagnosis not present

## 2014-08-07 DIAGNOSIS — F411 Generalized anxiety disorder: Secondary | ICD-10-CM | POA: Diagnosis not present

## 2014-08-07 DIAGNOSIS — M4120 Other idiopathic scoliosis, site unspecified: Secondary | ICD-10-CM | POA: Diagnosis not present

## 2014-08-08 DIAGNOSIS — R262 Difficulty in walking, not elsewhere classified: Secondary | ICD-10-CM | POA: Diagnosis not present

## 2014-08-08 DIAGNOSIS — IMO0001 Reserved for inherently not codable concepts without codable children: Secondary | ICD-10-CM | POA: Diagnosis not present

## 2014-08-08 DIAGNOSIS — M545 Low back pain, unspecified: Secondary | ICD-10-CM | POA: Diagnosis not present

## 2014-08-08 DIAGNOSIS — M47817 Spondylosis without myelopathy or radiculopathy, lumbosacral region: Secondary | ICD-10-CM | POA: Diagnosis not present

## 2014-08-09 DIAGNOSIS — R262 Difficulty in walking, not elsewhere classified: Secondary | ICD-10-CM | POA: Diagnosis not present

## 2014-08-09 DIAGNOSIS — M47817 Spondylosis without myelopathy or radiculopathy, lumbosacral region: Secondary | ICD-10-CM | POA: Diagnosis not present

## 2014-08-09 DIAGNOSIS — M545 Low back pain, unspecified: Secondary | ICD-10-CM | POA: Diagnosis not present

## 2014-08-09 DIAGNOSIS — IMO0001 Reserved for inherently not codable concepts without codable children: Secondary | ICD-10-CM | POA: Diagnosis not present

## 2014-08-14 DIAGNOSIS — R262 Difficulty in walking, not elsewhere classified: Secondary | ICD-10-CM | POA: Diagnosis not present

## 2014-08-14 DIAGNOSIS — M47817 Spondylosis without myelopathy or radiculopathy, lumbosacral region: Secondary | ICD-10-CM | POA: Diagnosis not present

## 2014-08-14 DIAGNOSIS — IMO0001 Reserved for inherently not codable concepts without codable children: Secondary | ICD-10-CM | POA: Diagnosis not present

## 2014-08-14 DIAGNOSIS — M545 Low back pain, unspecified: Secondary | ICD-10-CM | POA: Diagnosis not present

## 2014-08-16 DIAGNOSIS — IMO0001 Reserved for inherently not codable concepts without codable children: Secondary | ICD-10-CM | POA: Diagnosis not present

## 2014-08-16 DIAGNOSIS — R262 Difficulty in walking, not elsewhere classified: Secondary | ICD-10-CM | POA: Diagnosis not present

## 2014-08-16 DIAGNOSIS — M545 Low back pain, unspecified: Secondary | ICD-10-CM | POA: Diagnosis not present

## 2014-08-16 DIAGNOSIS — M47817 Spondylosis without myelopathy or radiculopathy, lumbosacral region: Secondary | ICD-10-CM | POA: Diagnosis not present

## 2014-08-21 DIAGNOSIS — M545 Low back pain, unspecified: Secondary | ICD-10-CM | POA: Diagnosis not present

## 2014-08-21 DIAGNOSIS — IMO0001 Reserved for inherently not codable concepts without codable children: Secondary | ICD-10-CM | POA: Diagnosis not present

## 2014-08-21 DIAGNOSIS — R262 Difficulty in walking, not elsewhere classified: Secondary | ICD-10-CM | POA: Diagnosis not present

## 2014-08-21 DIAGNOSIS — M47817 Spondylosis without myelopathy or radiculopathy, lumbosacral region: Secondary | ICD-10-CM | POA: Diagnosis not present

## 2014-08-23 DIAGNOSIS — M545 Low back pain, unspecified: Secondary | ICD-10-CM | POA: Diagnosis not present

## 2014-08-23 DIAGNOSIS — IMO0001 Reserved for inherently not codable concepts without codable children: Secondary | ICD-10-CM | POA: Diagnosis not present

## 2014-08-23 DIAGNOSIS — R262 Difficulty in walking, not elsewhere classified: Secondary | ICD-10-CM | POA: Diagnosis not present

## 2014-08-23 DIAGNOSIS — M47817 Spondylosis without myelopathy or radiculopathy, lumbosacral region: Secondary | ICD-10-CM | POA: Diagnosis not present

## 2014-08-28 DIAGNOSIS — R262 Difficulty in walking, not elsewhere classified: Secondary | ICD-10-CM | POA: Diagnosis not present

## 2014-08-28 DIAGNOSIS — IMO0001 Reserved for inherently not codable concepts without codable children: Secondary | ICD-10-CM | POA: Diagnosis not present

## 2014-08-28 DIAGNOSIS — M545 Low back pain, unspecified: Secondary | ICD-10-CM | POA: Diagnosis not present

## 2014-08-28 DIAGNOSIS — M47817 Spondylosis without myelopathy or radiculopathy, lumbosacral region: Secondary | ICD-10-CM | POA: Diagnosis not present

## 2014-08-30 DIAGNOSIS — M47896 Other spondylosis, lumbar region: Secondary | ICD-10-CM | POA: Diagnosis not present

## 2014-08-30 DIAGNOSIS — M545 Low back pain: Secondary | ICD-10-CM | POA: Diagnosis not present

## 2014-08-30 DIAGNOSIS — R262 Difficulty in walking, not elsewhere classified: Secondary | ICD-10-CM | POA: Diagnosis not present

## 2014-09-04 DIAGNOSIS — R262 Difficulty in walking, not elsewhere classified: Secondary | ICD-10-CM | POA: Diagnosis not present

## 2014-09-04 DIAGNOSIS — M545 Low back pain: Secondary | ICD-10-CM | POA: Diagnosis not present

## 2014-09-04 DIAGNOSIS — M47896 Other spondylosis, lumbar region: Secondary | ICD-10-CM | POA: Diagnosis not present

## 2014-10-04 DIAGNOSIS — J449 Chronic obstructive pulmonary disease, unspecified: Secondary | ICD-10-CM | POA: Diagnosis not present

## 2014-10-04 DIAGNOSIS — I10 Essential (primary) hypertension: Secondary | ICD-10-CM | POA: Diagnosis not present

## 2014-10-13 DIAGNOSIS — J019 Acute sinusitis, unspecified: Secondary | ICD-10-CM | POA: Diagnosis not present

## 2014-10-16 DIAGNOSIS — M47816 Spondylosis without myelopathy or radiculopathy, lumbar region: Secondary | ICD-10-CM | POA: Diagnosis not present

## 2014-10-16 DIAGNOSIS — M419 Scoliosis, unspecified: Secondary | ICD-10-CM | POA: Diagnosis not present

## 2014-10-16 DIAGNOSIS — M810 Age-related osteoporosis without current pathological fracture: Secondary | ICD-10-CM | POA: Diagnosis not present

## 2014-11-29 DIAGNOSIS — K210 Gastro-esophageal reflux disease with esophagitis: Secondary | ICD-10-CM | POA: Diagnosis not present

## 2014-11-29 DIAGNOSIS — E780 Pure hypercholesterolemia: Secondary | ICD-10-CM | POA: Diagnosis not present

## 2014-11-29 DIAGNOSIS — E781 Pure hyperglyceridemia: Secondary | ICD-10-CM | POA: Diagnosis not present

## 2014-12-06 DIAGNOSIS — R739 Hyperglycemia, unspecified: Secondary | ICD-10-CM | POA: Diagnosis not present

## 2014-12-06 DIAGNOSIS — M5430 Sciatica, unspecified side: Secondary | ICD-10-CM | POA: Diagnosis not present

## 2014-12-06 DIAGNOSIS — Z1389 Encounter for screening for other disorder: Secondary | ICD-10-CM | POA: Diagnosis not present

## 2014-12-06 DIAGNOSIS — M545 Low back pain: Secondary | ICD-10-CM | POA: Diagnosis not present

## 2014-12-06 DIAGNOSIS — F419 Anxiety disorder, unspecified: Secondary | ICD-10-CM | POA: Diagnosis not present

## 2014-12-06 DIAGNOSIS — K210 Gastro-esophageal reflux disease with esophagitis: Secondary | ICD-10-CM | POA: Diagnosis not present

## 2014-12-06 DIAGNOSIS — I10 Essential (primary) hypertension: Secondary | ICD-10-CM | POA: Diagnosis not present

## 2014-12-06 DIAGNOSIS — E782 Mixed hyperlipidemia: Secondary | ICD-10-CM | POA: Diagnosis not present

## 2015-01-06 DIAGNOSIS — R072 Precordial pain: Secondary | ICD-10-CM | POA: Diagnosis not present

## 2015-01-06 DIAGNOSIS — J449 Chronic obstructive pulmonary disease, unspecified: Secondary | ICD-10-CM | POA: Diagnosis not present

## 2015-01-06 DIAGNOSIS — I10 Essential (primary) hypertension: Secondary | ICD-10-CM | POA: Diagnosis not present

## 2015-01-06 DIAGNOSIS — Z79899 Other long term (current) drug therapy: Secondary | ICD-10-CM | POA: Diagnosis not present

## 2015-01-06 DIAGNOSIS — R0602 Shortness of breath: Secondary | ICD-10-CM | POA: Diagnosis not present

## 2015-01-06 DIAGNOSIS — R079 Chest pain, unspecified: Secondary | ICD-10-CM | POA: Diagnosis not present

## 2015-01-06 DIAGNOSIS — Z7982 Long term (current) use of aspirin: Secondary | ICD-10-CM | POA: Diagnosis not present

## 2015-01-06 DIAGNOSIS — Z87891 Personal history of nicotine dependence: Secondary | ICD-10-CM | POA: Diagnosis not present

## 2015-01-06 DIAGNOSIS — R531 Weakness: Secondary | ICD-10-CM | POA: Diagnosis not present

## 2015-01-08 DIAGNOSIS — R072 Precordial pain: Secondary | ICD-10-CM | POA: Diagnosis not present

## 2015-01-09 ENCOUNTER — Encounter: Payer: Self-pay | Admitting: Cardiology

## 2015-01-09 ENCOUNTER — Ambulatory Visit (INDEPENDENT_AMBULATORY_CARE_PROVIDER_SITE_OTHER): Payer: Medicare Other | Admitting: Cardiology

## 2015-01-09 VITALS — BP 138/80 | HR 87 | Ht 60.0 in | Wt 160.0 lb

## 2015-01-09 DIAGNOSIS — R0989 Other specified symptoms and signs involving the circulatory and respiratory systems: Secondary | ICD-10-CM

## 2015-01-09 DIAGNOSIS — R002 Palpitations: Secondary | ICD-10-CM | POA: Diagnosis not present

## 2015-01-09 NOTE — Progress Notes (Signed)
Cardiology Office Note   Date:  01/09/2015   ID:  Dorothy Bryant, DOB 05/23/1941, MRN [id number]  PCP:  Estanislado PandySASSER,PAUL W, MD  Cardiologist:   Rollene RotundaJames Roshell Brigham, MD   No chief complaint on file.     History of Present Illness: Dorothy GrahamJoyce Langenderfer is a 74 y.o. female who presents for the patient presents for evaluation of palpitations. She had a spell Sunday while at church. She felt weak and couldn't move her arms or legs very adequately. She actually had to be carried to the car. [facility]. There she did have elevated blood pressure 193/89. I reviewed these records. She felt like her chest was quivering. She had some chest discomfort. However, there was no objective evidence of ischemia. There were no arrhythmias noted.  She did not have any syncope though she was lightheaded. She says she might get episodes of palpitations a couple times per week but not every week. She's been told to take Xanax in the past when this happens. She did have chest pain in 2012 and had a cardiac catheterization with normal coronaries. She's had a normal echocardiogram as well. She otherwise is very limited by back pain and COPD. She can do very little activity without being short of breath. She's not describing new substernal chest pressure, neck or arm discomfort. She's not having new palpitations, presyncope or syncope. She's had no weight gain or edema.    Past Medical History  Diagnosis Date  . Chest pain     Cardiac catheterization 7/12: Normal coronary arteries, EF 60-65%, question mid LVOT obstruction with a 10 mm mercury gradient;   echo 7/12: Mild LVH, EF 55-60%, grade 2 diastolic dysfunction, mild LAE, PAS P. 29  . COPD (chronic obstructive pulmonary disease)   . Hypertension   . GERD (gastroesophageal reflux disease)   . Heart palpitations     Event monitor 8/12  . LBBB (left bundle branch block)     Past Surgical History  Procedure Laterality Date  . Cardiac catheterization   06/30/2011    Est. EF of 60-65% with normal coronary arteries --   . Abdominal hysterectomy    . Cholecystectomy    . Cervical spine surgery    . Cataract extraction       Current Outpatient Prescriptions  Medication Sig Dispense Refill  . albuterol (PROVENTIL) (2.5 MG/3ML) 0.083% nebulizer solution Take 2.5 mg by nebulization 2 (two) times daily.      Marland Kitchen. ALPRAZolam (XANAX) 0.5 MG tablet Take 0.5 mg by mouth at bedtime as needed.      Marland Kitchen. aspirin 81 MG tablet Take 81 mg by mouth daily.      . budesonide-formoterol (SYMBICORT) 160-4.5 MCG/ACT inhaler Inhale 2 puffs into the lungs 2 (two) times daily.      . citalopram (CELEXA) 10 MG tablet Take 10 mg by mouth daily.      Marland Kitchen. HYDROcodone-acetaminophen (NORCO/VICODIN) 5-325 MG per tablet Take 1 tablet by mouth every 6 (six) hours as needed for moderate pain.    Marland Kitchen. losartan-hydrochlorothiazide (HYZAAR) 100-12.5 MG per tablet Take 1 tablet by mouth daily. Take 1/2 tablet daily    . metoprolol succinate (TOPROL-XL) 25 MG 24 hr tablet Take 1 tablet (25 mg total) by mouth daily. 30 tablet 12  . omeprazole (PRILOSEC) 20 MG capsule Take 20 mg by mouth 2 (two) times daily before a meal.     . simvastatin (ZOCOR) 10 MG tablet Take 10 mg by mouth daily.    .Marland Kitchen  tiotropium (SPIRIVA) 18 MCG inhalation capsule Place 18 mcg into inhaler and inhale daily.       No current facility-administered medications for this visit.    Allergies:   Tagamet    Social History:  The patient  reports that she quit smoking about 6 years ago. Her smoking use included Cigarettes. She has a 50 pack-year smoking history. She does not have any smokeless tobacco history on file. She reports that she does not drink alcohol.   Family History:  The patient's family history includes Acute lymphoblastic leukemia in her maternal grandfather; Heart attack (age of onset: 74) in her father; Hypertension in her mother.    ROS:  Please see the history of present illness.   Otherwise, review  of systems are positive for headaches, dizziness, nausea, reflux, joint pains, leg cramps..   All other systems are reviewed and negative.    PHYSICAL EXAM: VS:  BP 138/80 mmHg  Pulse 87  Ht 5' (1.524 m)  Wt 160 lb (72.576 kg)  BMI 31.25 kg/m2 , BMI Body mass index is 31.25 kg/(m^2). GEN: Well nourished, well developed, in no acute distress HEENT: normal Neck: no JVD, possible carotid bruits, no masses Cardiac: RRR; no murmurs, rubs, or gallops,no edema  Respiratory:  clear to auscultation bilaterally, normal work of breathing GI: soft, nontender, nondistended, + BS MS: no deformity or atrophy Skin: warm and dry, no rash Neuro:  Strength and sensation are intact Psych: euthymic mood, full affect   EKG:  EKG is ordered today. The ekg ordered today demonstrates sinus rhythm, rate 87, axis within normal limits, left bundle branch block    Wt Readings from Last 3 Encounters:  01/09/15 160 lb (72.576 kg)  09/23/11 145 lb 1.9 oz (65.826 kg)  07/23/11 147 lb (66.679 kg)      Other studies Reviewed: Additional studies/ records that were reviewed today include: Outside emergency room and office records. Catheterization 2012.Marland Kitchen Review of the above records demonstrates: See above.   ASSESSMENT AND PLAN: PALPITATIONS:  The patient has multiple symptoms. For now she'll get a 21 day event monitor. I will defer any lab draws including TSH to [facility] W, MD.  This could be panic as well and we discussed this. Further evaluation will be based on this result.  Anaka Beazer will need a 21 day event monitor.  The patients symptoms necessitate an event monitor.  The symptoms are too infrequent to be identified on a Holter monitor.    DIZZINESS:  The patient did have dizziness and difficulty moving her extremities. I will order carotid Dopplers.  HTN:      Current medicines are reviewed at length with the patient today.  The patient does not have concerns regarding medicines.  The following  changes have been made:  no change  Labs/ tests ordered today include: Event monitor, BP diary, carotid Doppler   No orders of the defined types were placed in this encounter.     Disposition:   FU with me in one month.   Signed, Rollene Rotunda, MD  01/09/2015 10:08 AM    [facility]

## 2015-01-09 NOTE — Patient Instructions (Signed)
The current medical regimen is effective;  continue present plan and medications.  Your physician has requested that you have a carotid duplex. This test is an ultrasound of the carotid arteries in your neck. It looks at blood flow through these arteries that supply the brain with blood. Allow one hour for this exam. There are no restrictions or special instructions.  Your physician has recommended that you wear an event monitor. Event monitors are medical devices that record the heart's electrical activity. Doctors most often us these monitors to diagnose arrhythmias. Arrhythmias are problems with the speed or rhythm of the heartbeat. The monitor is a small, portable device. You can wear one while you do your normal daily activities. This is usually used to diagnose what is causing palpitations/syncope (passing out).  Follow up in one month with Dr [place]  Thank you for choosing [facility]!!

## 2015-01-15 ENCOUNTER — Encounter (HOSPITAL_COMMUNITY): Payer: Medicare Other

## 2015-01-22 ENCOUNTER — Ambulatory Visit (HOSPITAL_COMMUNITY): Payer: Medicare Other | Attending: Cardiology | Admitting: Cardiology

## 2015-01-22 ENCOUNTER — Encounter (INDEPENDENT_AMBULATORY_CARE_PROVIDER_SITE_OTHER): Payer: Medicare Other

## 2015-01-22 ENCOUNTER — Encounter: Payer: Self-pay | Admitting: *Deleted

## 2015-01-22 DIAGNOSIS — R002 Palpitations: Secondary | ICD-10-CM | POA: Diagnosis not present

## 2015-01-22 DIAGNOSIS — I2510 Atherosclerotic heart disease of native coronary artery without angina pectoris: Secondary | ICD-10-CM | POA: Diagnosis not present

## 2015-01-22 DIAGNOSIS — I6523 Occlusion and stenosis of bilateral carotid arteries: Secondary | ICD-10-CM

## 2015-01-22 DIAGNOSIS — J449 Chronic obstructive pulmonary disease, unspecified: Secondary | ICD-10-CM | POA: Insufficient documentation

## 2015-01-22 DIAGNOSIS — I10 Essential (primary) hypertension: Secondary | ICD-10-CM | POA: Insufficient documentation

## 2015-01-22 DIAGNOSIS — R0989 Other specified symptoms and signs involving the circulatory and respiratory systems: Secondary | ICD-10-CM | POA: Diagnosis not present

## 2015-01-22 NOTE — Progress Notes (Signed)
Carotid duplex performed 

## 2015-01-22 NOTE — Progress Notes (Signed)
Patient ID: Dorothy Bryant, female   DOB: 10-14-1941, 74 y.o.   MRN: [id number] [employer] verite 21 day cardiac event monitor applied to patient.

## 2015-01-29 ENCOUNTER — Ambulatory Visit (HOSPITAL_COMMUNITY)
Admission: RE | Admit: 2015-01-29 | Discharge: 2015-01-29 | Disposition: A | Discharge status: Home / Self Care | Payer: Medicare Other | Source: Ambulatory Visit | Attending: Pulmonary Disease | Admitting: Pulmonary Disease

## 2015-01-29 ENCOUNTER — Ambulatory Visit: Payer: Medicare Other | Admitting: Cardiology

## 2015-01-29 ENCOUNTER — Other Ambulatory Visit (HOSPITAL_COMMUNITY): Payer: Self-pay | Admitting: Pulmonary Disease

## 2015-01-29 DIAGNOSIS — R0602 Shortness of breath: Secondary | ICD-10-CM

## 2015-01-30 DIAGNOSIS — Z9889 Other specified postprocedural states: Secondary | ICD-10-CM | POA: Diagnosis not present

## 2015-01-30 DIAGNOSIS — M5032 Other cervical disc degeneration, mid-cervical region: Secondary | ICD-10-CM | POA: Diagnosis not present

## 2015-01-30 DIAGNOSIS — Z981 Arthrodesis status: Secondary | ICD-10-CM | POA: Diagnosis not present

## 2015-01-30 DIAGNOSIS — M47812 Spondylosis without myelopathy or radiculopathy, cervical region: Secondary | ICD-10-CM | POA: Diagnosis not present

## 2015-01-30 DIAGNOSIS — M47816 Spondylosis without myelopathy or radiculopathy, lumbar region: Secondary | ICD-10-CM | POA: Diagnosis not present

## 2015-01-30 DIAGNOSIS — M419 Scoliosis, unspecified: Secondary | ICD-10-CM | POA: Diagnosis not present

## 2015-02-04 ENCOUNTER — Telehealth: Payer: Self-pay | Admitting: *Deleted

## 2015-02-04 MED ORDER — METOPROLOL SUCCINATE ER 50 MG PO TB24: 50.0000 mg | ORAL_TABLET | Freq: Every day | ORAL | Status: DC

## 2015-02-04 NOTE — Telephone Encounter (Signed)
Spoke to patient received cardionet monitor strips from 02/02/15 revealing sinus tach at rate 155 bpm.Spoke to DOD Dr.Kelly he advised to increase toprol xl to 50 mg daily.Advised to keep appointment with Dr.Hochrein 02/13/15 at 11:45 am.

## 2015-02-04 NOTE — Telephone Encounter (Signed)
[employer] faxed monitor strips report for episode of tachycardia 02/02/15. Dr. Johney FrameAllred reviewed strips and information from patient regarding recent BP 144/73 and 152/64 yesterday. Dr. Johney FrameAllred advises patient to increase Toprol from 25 mg daily to 50 mg daily. Also this strip to be shown to Dr. Antoine PocheHochrein upon his return. Patient given instructions and she agreed to increase to Toprol 50 mg by mouth daily. She states that currently she is doing "fine and just up doing a few things around the house". Denies current CP, but does have mild SOB upon exertion at times. Monitor strips/report faxed to Dr. Antoine PocheHochrein.

## 2015-02-12 DIAGNOSIS — M542 Cervicalgia: Secondary | ICD-10-CM | POA: Diagnosis not present

## 2015-02-12 DIAGNOSIS — M545 Low back pain: Secondary | ICD-10-CM | POA: Diagnosis not present

## 2015-02-12 DIAGNOSIS — M47892 Other spondylosis, cervical region: Secondary | ICD-10-CM | POA: Diagnosis not present

## 2015-02-12 DIAGNOSIS — R293 Abnormal posture: Secondary | ICD-10-CM | POA: Diagnosis not present

## 2015-02-13 ENCOUNTER — Ambulatory Visit (INDEPENDENT_AMBULATORY_CARE_PROVIDER_SITE_OTHER): Payer: Medicare Other | Admitting: Cardiology

## 2015-02-13 ENCOUNTER — Encounter: Payer: Self-pay | Admitting: Cardiology

## 2015-02-13 VITALS — BP 158/70 | HR 76 | Ht 60.0 in | Wt 159.0 lb

## 2015-02-13 DIAGNOSIS — M545 Low back pain: Secondary | ICD-10-CM | POA: Diagnosis not present

## 2015-02-13 DIAGNOSIS — R002 Palpitations: Secondary | ICD-10-CM | POA: Diagnosis not present

## 2015-02-13 DIAGNOSIS — I6523 Occlusion and stenosis of bilateral carotid arteries: Secondary | ICD-10-CM

## 2015-02-13 DIAGNOSIS — M542 Cervicalgia: Secondary | ICD-10-CM | POA: Diagnosis not present

## 2015-02-13 DIAGNOSIS — R293 Abnormal posture: Secondary | ICD-10-CM | POA: Diagnosis not present

## 2015-02-13 DIAGNOSIS — M47892 Other spondylosis, cervical region: Secondary | ICD-10-CM | POA: Diagnosis not present

## 2015-02-13 NOTE — Patient Instructions (Signed)
The current medical regimen is effective;  continue present plan and medications.  Follow up in 1 year with Dr. Hochrein in [place].  You will receive a letter in the mail 2 months before you are due.  Please call us when you receive this letter to schedule your follow up appointment.  Thank you for choosing [facility]!!     

## 2015-02-13 NOTE — Progress Notes (Signed)
Cardiology Office Note   Date:  02/13/2015   ID:  Dorothy Bryant, DOB 21-Feb-1941, MRN [id number]  PCP:  Estanislado PandySASSER,PAUL W, MD  Cardiologist:   Rollene RotundaJames Jaidyn Usery, MD   Chief Complaint  Patient presents with  . Palpitations     History of Present Illness: Dorothy Bryant is a 74 y.o. female who presents for the patient presents for evaluation of palpitations and elevated BP.  This is her second visit.  .She did have chest pain in 2012 and had a cardiac catheterization with normal coronaries. She's had a normal echocardiogram as well.  After the first visit she did have carotid Dopplers which demonstrated 40-59% left stenosis and less significant right stenosis.  Telemetry demonstrated some tachycardia although his and possible understand the mechanism. It looked like sinus tachycardia and was regular. Other SVT could not be excluded. We increased her beta blocker. Since that time she's done much better. She's a she's not noticing any fluttering. She's not had any presyncope or syncope. She denies any chest pressure, neck or arm discomfort. She has had no new shortness of breath.  Past Medical History  Diagnosis Date  . Chest pain     Cardiac catheterization 7/12: Normal coronary arteries, EF 60-65%, question mid LVOT obstruction with a 10 mm mercury gradient;   echo 7/12: Mild LVH, EF 55-60%, grade 2 diastolic dysfunction, mild LAE, PAS P. 29  . COPD (chronic obstructive pulmonary disease)   . Hypertension   . GERD (gastroesophageal reflux disease)   . Heart palpitations     Event monitor 8/12  . LBBB (left bundle branch block)     Past Surgical History  Procedure Laterality Date  . Cardiac catheterization  06/30/2011    Est. EF of 60-65% with normal coronary arteries --   . Abdominal hysterectomy    . Cholecystectomy    . Cervical spine surgery    . Cataract extraction       Current Outpatient Prescriptions  Medication Sig Dispense Refill  . albuterol (PROVENTIL) (2.5 MG/3ML) 0.083%  nebulizer solution Take 2.5 mg by nebulization 2 (two) times daily.      Marland Kitchen. ALPRAZolam (XANAX) 0.5 MG tablet Take 0.5 mg by mouth at bedtime as needed.      Marland Kitchen. aspirin 81 MG tablet Take 81 mg by mouth daily.      . budesonide-formoterol (SYMBICORT) 160-4.5 MCG/ACT inhaler Inhale 2 puffs into the lungs 2 (two) times daily.      . citalopram (CELEXA) 10 MG tablet Take 10 mg by mouth daily.      Marland Kitchen. HYDROcodone-acetaminophen (NORCO/VICODIN) 5-325 MG per tablet Take 1 tablet by mouth every 6 (six) hours as needed for moderate pain.    Marland Kitchen. losartan-hydrochlorothiazide (HYZAAR) 100-12.5 MG per tablet Take 1 tablet by mouth daily. Take 1/2 tablet daily    . metoprolol succinate (TOPROL-XL) 50 MG 24 hr tablet Take 1 tablet (50 mg total) by mouth daily. Take with or immediately following a meal. 30 tablet 6  . omeprazole (PRILOSEC) 20 MG capsule Take 20 mg by mouth 2 (two) times daily before a meal.     . simvastatin (ZOCOR) 10 MG tablet Take 10 mg by mouth daily.    Marland Kitchen. tiotropium (SPIRIVA) 18 MCG inhalation capsule Place 18 mcg into inhaler and inhale daily.       No current facility-administered medications for this visit.    Allergies:   Tagamet    ROS:  Please see the history of present illness.   Otherwise, review  of systems are positive for non   All other systems are reviewed and negative.    PHYSICAL EXAM: VS:  BP 158/70 mmHg  Pulse 76  Ht 5' (1.524 m)  Wt 159 lb (72.122 kg)  BMI 31.05 kg/m2 , BMI Body mass index is 31.05 kg/(m^2). GEN: Well nourished, well developed, in no acute distress HEENT: normal Neck: no JVD, possible carotid bruits, no masses Cardiac: RRR; no murmurs, rubs, or gallops,no edema  Respiratory:  clear to auscultation bilaterally, normal work of breathing GI: soft, nontender, nondistended, + BS MS: no deformity or atrophy Skin: warm and dry, no rash Neuro:  Strength and sensation are intact Psych: euthymic mood, full affect   EKG:  EKG is not ordered today.    Wt  Readings from Last 3 Encounters:  02/13/15 159 lb (72.122 kg)  01/09/15 160 lb (72.576 kg)  09/23/11 145 lb 1.9 oz (65.826 kg)      ASSESSMENT AND PLAN:  PALPITATIONS:  These are now controlled with the increased dose of beta blocker. No change in therapy is indicated.  DIZZINESS:  She's no longer bothered by this. No further workup is planned.  HTN:   I reviewed her blood pressure diary and her blood pressures are slightly elevated. We discussed weight loss. If they creep up she'll let me know and I will probably add another medicine.  CAROTID STENOSIS:  She will have follow-up in 1 year.   Current medicines are reviewed at length with the patient today.  The patient does not have concerns regarding medicines.  The following changes have been made:  no change     No orders of the defined types were placed in this encounter.     Disposition:   FU with me in one year.   Signed, Rollene Rotunda, MD  02/13/2015 12:28 PM    [facility]

## 2015-02-14 DIAGNOSIS — M542 Cervicalgia: Secondary | ICD-10-CM | POA: Diagnosis not present

## 2015-02-14 DIAGNOSIS — M47892 Other spondylosis, cervical region: Secondary | ICD-10-CM | POA: Diagnosis not present

## 2015-02-14 DIAGNOSIS — M545 Low back pain: Secondary | ICD-10-CM | POA: Diagnosis not present

## 2015-02-14 DIAGNOSIS — R293 Abnormal posture: Secondary | ICD-10-CM | POA: Diagnosis not present

## 2015-02-18 DIAGNOSIS — R293 Abnormal posture: Secondary | ICD-10-CM | POA: Diagnosis not present

## 2015-02-18 DIAGNOSIS — M47892 Other spondylosis, cervical region: Secondary | ICD-10-CM | POA: Diagnosis not present

## 2015-02-18 DIAGNOSIS — M542 Cervicalgia: Secondary | ICD-10-CM | POA: Diagnosis not present

## 2015-02-18 DIAGNOSIS — M545 Low back pain: Secondary | ICD-10-CM | POA: Diagnosis not present

## 2015-02-20 DIAGNOSIS — M47892 Other spondylosis, cervical region: Secondary | ICD-10-CM | POA: Diagnosis not present

## 2015-02-20 DIAGNOSIS — M542 Cervicalgia: Secondary | ICD-10-CM | POA: Diagnosis not present

## 2015-02-20 DIAGNOSIS — R293 Abnormal posture: Secondary | ICD-10-CM | POA: Diagnosis not present

## 2015-02-20 DIAGNOSIS — M545 Low back pain: Secondary | ICD-10-CM | POA: Diagnosis not present

## 2015-02-21 DIAGNOSIS — M47892 Other spondylosis, cervical region: Secondary | ICD-10-CM | POA: Diagnosis not present

## 2015-02-21 DIAGNOSIS — M542 Cervicalgia: Secondary | ICD-10-CM | POA: Diagnosis not present

## 2015-02-21 DIAGNOSIS — R293 Abnormal posture: Secondary | ICD-10-CM | POA: Diagnosis not present

## 2015-02-21 DIAGNOSIS — M545 Low back pain: Secondary | ICD-10-CM | POA: Diagnosis not present

## 2015-02-25 DIAGNOSIS — R293 Abnormal posture: Secondary | ICD-10-CM | POA: Diagnosis not present

## 2015-02-25 DIAGNOSIS — M545 Low back pain: Secondary | ICD-10-CM | POA: Diagnosis not present

## 2015-02-25 DIAGNOSIS — M47892 Other spondylosis, cervical region: Secondary | ICD-10-CM | POA: Diagnosis not present

## 2015-02-25 DIAGNOSIS — M542 Cervicalgia: Secondary | ICD-10-CM | POA: Diagnosis not present

## 2015-02-27 DIAGNOSIS — R293 Abnormal posture: Secondary | ICD-10-CM | POA: Diagnosis not present

## 2015-02-27 DIAGNOSIS — M47892 Other spondylosis, cervical region: Secondary | ICD-10-CM | POA: Diagnosis not present

## 2015-02-27 DIAGNOSIS — M545 Low back pain: Secondary | ICD-10-CM | POA: Diagnosis not present

## 2015-02-27 DIAGNOSIS — M542 Cervicalgia: Secondary | ICD-10-CM | POA: Diagnosis not present

## 2015-02-28 DIAGNOSIS — M545 Low back pain: Secondary | ICD-10-CM | POA: Diagnosis not present

## 2015-02-28 DIAGNOSIS — M542 Cervicalgia: Secondary | ICD-10-CM | POA: Diagnosis not present

## 2015-02-28 DIAGNOSIS — R293 Abnormal posture: Secondary | ICD-10-CM | POA: Diagnosis not present

## 2015-02-28 DIAGNOSIS — M47892 Other spondylosis, cervical region: Secondary | ICD-10-CM | POA: Diagnosis not present

## 2015-03-04 DIAGNOSIS — R293 Abnormal posture: Secondary | ICD-10-CM | POA: Diagnosis not present

## 2015-03-04 DIAGNOSIS — M545 Low back pain: Secondary | ICD-10-CM | POA: Diagnosis not present

## 2015-03-04 DIAGNOSIS — M542 Cervicalgia: Secondary | ICD-10-CM | POA: Diagnosis not present

## 2015-03-04 DIAGNOSIS — M47892 Other spondylosis, cervical region: Secondary | ICD-10-CM | POA: Diagnosis not present

## 2015-03-06 DIAGNOSIS — M542 Cervicalgia: Secondary | ICD-10-CM | POA: Diagnosis not present

## 2015-03-06 DIAGNOSIS — M47892 Other spondylosis, cervical region: Secondary | ICD-10-CM | POA: Diagnosis not present

## 2015-03-06 DIAGNOSIS — R293 Abnormal posture: Secondary | ICD-10-CM | POA: Diagnosis not present

## 2015-03-06 DIAGNOSIS — M545 Low back pain: Secondary | ICD-10-CM | POA: Diagnosis not present

## 2015-03-07 DIAGNOSIS — M542 Cervicalgia: Secondary | ICD-10-CM | POA: Diagnosis not present

## 2015-03-07 DIAGNOSIS — R293 Abnormal posture: Secondary | ICD-10-CM | POA: Diagnosis not present

## 2015-03-07 DIAGNOSIS — M47892 Other spondylosis, cervical region: Secondary | ICD-10-CM | POA: Diagnosis not present

## 2015-03-07 DIAGNOSIS — M545 Low back pain: Secondary | ICD-10-CM | POA: Diagnosis not present

## 2015-03-13 DIAGNOSIS — I10 Essential (primary) hypertension: Secondary | ICD-10-CM | POA: Diagnosis not present

## 2015-03-13 DIAGNOSIS — J449 Chronic obstructive pulmonary disease, unspecified: Secondary | ICD-10-CM | POA: Diagnosis not present

## 2015-04-09 DIAGNOSIS — M4302 Spondylolysis, cervical region: Secondary | ICD-10-CM | POA: Diagnosis not present

## 2015-04-09 DIAGNOSIS — M419 Scoliosis, unspecified: Secondary | ICD-10-CM | POA: Diagnosis not present

## 2015-04-09 DIAGNOSIS — M47816 Spondylosis without myelopathy or radiculopathy, lumbar region: Secondary | ICD-10-CM | POA: Diagnosis not present

## 2015-05-02 ENCOUNTER — Inpatient Hospital Stay (HOSPITAL_COMMUNITY)
Admission: EM | Admit: 2015-05-02 | Discharge: 2015-05-05 | DRG: 190 | Disposition: A | Discharge status: Home / Self Care | Payer: Medicare Other | Attending: Internal Medicine | Admitting: Internal Medicine

## 2015-05-02 ENCOUNTER — Encounter (HOSPITAL_COMMUNITY): Payer: Self-pay

## 2015-05-02 ENCOUNTER — Emergency Department (HOSPITAL_COMMUNITY): Payer: Medicare Other

## 2015-05-02 DIAGNOSIS — R05 Cough: Secondary | ICD-10-CM | POA: Diagnosis not present

## 2015-05-02 DIAGNOSIS — J9601 Acute respiratory failure with hypoxia: Secondary | ICD-10-CM | POA: Diagnosis present

## 2015-05-02 DIAGNOSIS — Z806 Family history of leukemia: Secondary | ICD-10-CM

## 2015-05-02 DIAGNOSIS — E785 Hyperlipidemia, unspecified: Secondary | ICD-10-CM | POA: Diagnosis present

## 2015-05-02 DIAGNOSIS — R0602 Shortness of breath: Secondary | ICD-10-CM | POA: Diagnosis not present

## 2015-05-02 DIAGNOSIS — Z87891 Personal history of nicotine dependence: Secondary | ICD-10-CM

## 2015-05-02 DIAGNOSIS — Z981 Arthrodesis status: Secondary | ICD-10-CM

## 2015-05-02 DIAGNOSIS — Z7982 Long term (current) use of aspirin: Secondary | ICD-10-CM | POA: Diagnosis not present

## 2015-05-02 DIAGNOSIS — N289 Disorder of kidney and ureter, unspecified: Secondary | ICD-10-CM

## 2015-05-02 DIAGNOSIS — I10 Essential (primary) hypertension: Secondary | ICD-10-CM | POA: Diagnosis not present

## 2015-05-02 DIAGNOSIS — K219 Gastro-esophageal reflux disease without esophagitis: Secondary | ICD-10-CM | POA: Diagnosis present

## 2015-05-02 DIAGNOSIS — Z8249 Family history of ischemic heart disease and other diseases of the circulatory system: Secondary | ICD-10-CM

## 2015-05-02 DIAGNOSIS — J441 Chronic obstructive pulmonary disease with (acute) exacerbation: Secondary | ICD-10-CM | POA: Diagnosis not present

## 2015-05-02 DIAGNOSIS — R079 Chest pain, unspecified: Secondary | ICD-10-CM | POA: Diagnosis not present

## 2015-05-02 LAB — BRAIN NATRIURETIC PEPTIDE: B Natriuretic Peptide: 60 pg/mL (ref 0.0–100.0)

## 2015-05-02 LAB — BASIC METABOLIC PANEL
Anion gap: 12 (ref 5–15)
BUN: 22 mg/dL — ABNORMAL HIGH (ref 6–20)
CHLORIDE: 100 mmol/L — AB (ref 101–111)
CO2: 27 mmol/L (ref 22–32)
CREATININE: 1.35 mg/dL — AB (ref 0.44–1.00)
Calcium: 8.7 mg/dL — ABNORMAL LOW (ref 8.9–10.3)
GFR calc Af Amer: 44 mL/min — ABNORMAL LOW (ref >60)
GFR calc non Af Amer: 38 mL/min — ABNORMAL LOW (ref >60)
GLUCOSE: 93 mg/dL (ref 65–99)
Potassium: 3.9 mmol/L (ref 3.5–5.1)
SODIUM: 139 mmol/L (ref 135–145)

## 2015-05-02 LAB — CBC WITH DIFFERENTIAL/PLATELET
BASOS ABS: 0.1 10*3/uL (ref 0.0–0.1)
BASOS PCT: 1 % (ref 0–1)
Eosinophils Absolute: 0.5 10*3/uL (ref 0.0–0.7)
Eosinophils Relative: 5 % (ref 0–5)
HCT: 37.9 % (ref 36.0–46.0)
Hemoglobin: 12 g/dL (ref 12.0–15.0)
Lymphocytes Relative: 30 % (ref 12–46)
Lymphs Abs: 2.9 10*3/uL (ref 0.7–4.0)
MCH: 28.2 pg (ref 26.0–34.0)
MCHC: 31.7 g/dL (ref 30.0–36.0)
MCV: 89.2 fL (ref 78.0–100.0)
MONO ABS: 0.7 10*3/uL (ref 0.1–1.0)
Monocytes Relative: 7 % (ref 3–12)
NEUTROS PCT: 57 % (ref 43–77)
Neutro Abs: 5.5 10*3/uL (ref 1.7–7.7)
Platelets: 329 10*3/uL (ref 150–400)
RBC: 4.25 MIL/uL (ref 3.87–5.11)
RDW: 14.6 % (ref 11.5–15.5)
WBC: 9.6 10*3/uL (ref 4.0–10.5)

## 2015-05-02 LAB — TROPONIN I: Troponin I: <0.03 ng/mL (ref <0.031)

## 2015-05-02 LAB — D-DIMER, QUANTITATIVE: D-Dimer, Quant: <0.27 ug/mL-FEU (ref 0.00–0.48)

## 2015-05-02 MED ORDER — ONDANSETRON HCL 4 MG PO TABS: 4.0000 mg | ORAL_TABLET | Freq: Four times a day (QID) | ORAL | Status: DC | PRN

## 2015-05-02 MED ORDER — SIMVASTATIN 10 MG PO TABS
10.0000 mg | ORAL_TABLET | Freq: Every evening | ORAL | Status: DC
  Administered 2015-05-03 – 2015-05-04 (×2): 10 mg via ORAL
  Filled 2015-05-02 (×2): qty 1

## 2015-05-02 MED ORDER — METHYLPREDNISOLONE SODIUM SUCC 125 MG IJ SOLR
125.0000 mg | Freq: Four times a day (QID) | INTRAMUSCULAR | Status: DC
  Administered 2015-05-03: 125 mg via INTRAVENOUS
  Filled 2015-05-02: qty 2

## 2015-05-02 MED ORDER — METOPROLOL SUCCINATE ER 50 MG PO TB24
50.0000 mg | ORAL_TABLET | Freq: Every day | ORAL | Status: DC
  Administered 2015-05-03 – 2015-05-05 (×3): 50 mg via ORAL
  Filled 2015-05-02 (×3): qty 1

## 2015-05-02 MED ORDER — METHYLPREDNISOLONE SODIUM SUCC 125 MG IJ SOLR
125.0000 mg | Freq: Once | INTRAMUSCULAR | Status: AC
  Administered 2015-05-02: 125 mg via INTRAVENOUS
  Filled 2015-05-02: qty 2

## 2015-05-02 MED ORDER — BUDESONIDE-FORMOTEROL FUMARATE 160-4.5 MCG/ACT IN AERO
2.0000 | INHALATION_SPRAY | Freq: Two times a day (BID) | RESPIRATORY_TRACT | Status: DC
  Administered 2015-05-03 – 2015-05-05 (×5): 2 via RESPIRATORY_TRACT
  Filled 2015-05-02: qty 6

## 2015-05-02 MED ORDER — ASPIRIN 81 MG PO TABS: 81.0000 mg | ORAL_TABLET | Freq: Every day | ORAL | Status: DC

## 2015-05-02 MED ORDER — SODIUM CHLORIDE 0.9 % IV SOLN: INTRAVENOUS | Status: DC

## 2015-05-02 MED ORDER — HYDROCHLOROTHIAZIDE 10 MG/ML ORAL SUSPENSION
6.2500 mg | Freq: Every day | ORAL | Status: DC
Start: 2015-05-03 — End: 2015-05-03
  Filled 2015-05-02: qty 1.25

## 2015-05-02 MED ORDER — ALBUTEROL SULFATE (2.5 MG/3ML) 0.083% IN NEBU: 2.5000 mg | INHALATION_SOLUTION | Freq: Three times a day (TID) | RESPIRATORY_TRACT | Status: DC

## 2015-05-02 MED ORDER — LOSARTAN POTASSIUM-HCTZ 100-12.5 MG PO TABS: 0.5000 | ORAL_TABLET | Freq: Every day | ORAL | Status: DC

## 2015-05-02 MED ORDER — LEVOFLOXACIN 500 MG PO TABS
500.0000 mg | ORAL_TABLET | Freq: Once | ORAL | Status: AC
  Administered 2015-05-02: 500 mg via ORAL
  Filled 2015-05-02: qty 1

## 2015-05-02 MED ORDER — SODIUM CHLORIDE 0.9 % IJ SOLN
3.0000 mL | Freq: Two times a day (BID) | INTRAMUSCULAR | Status: DC
  Administered 2015-05-03 (×2): 3 mL via INTRAVENOUS

## 2015-05-02 MED ORDER — ALBUTEROL SULFATE (2.5 MG/3ML) 0.083% IN NEBU: 2.5000 mg | INHALATION_SOLUTION | RESPIRATORY_TRACT | Status: AC | PRN

## 2015-05-02 MED ORDER — TIOTROPIUM BROMIDE MONOHYDRATE 18 MCG IN CAPS
18.0000 ug | ORAL_CAPSULE | Freq: Every evening | RESPIRATORY_TRACT | Status: DC
  Filled 2015-05-02: qty 5

## 2015-05-02 MED ORDER — HYDROCODONE-ACETAMINOPHEN 5-325 MG PO TABS
1.0000 | ORAL_TABLET | Freq: Four times a day (QID) | ORAL | Status: DC | PRN
  Administered 2015-05-03 – 2015-05-04 (×4): 1 via ORAL
  Filled 2015-05-02 (×4): qty 1

## 2015-05-02 MED ORDER — SODIUM CHLORIDE 0.9 % IV SOLN
INTRAVENOUS | Status: DC
  Administered 2015-05-04: 09:00:00 via INTRAVENOUS

## 2015-05-02 MED ORDER — ONDANSETRON HCL 4 MG/2ML IJ SOLN: 4.0000 mg | Freq: Four times a day (QID) | INTRAMUSCULAR | Status: DC | PRN

## 2015-05-02 MED ORDER — IPRATROPIUM-ALBUTEROL 0.5-2.5 (3) MG/3ML IN SOLN
3.0000 mL | Freq: Once | RESPIRATORY_TRACT | Status: AC
  Administered 2015-05-02: 3 mL via RESPIRATORY_TRACT
  Filled 2015-05-02: qty 3

## 2015-05-02 MED ORDER — ALPRAZOLAM 0.5 MG PO TABS
0.5000 mg | ORAL_TABLET | Freq: Every day | ORAL | Status: DC | PRN
  Administered 2015-05-04: 0.5 mg via ORAL
  Filled 2015-05-02: qty 1

## 2015-05-02 MED ORDER — CITALOPRAM HYDROBROMIDE 20 MG PO TABS
10.0000 mg | ORAL_TABLET | Freq: Every day | ORAL | Status: DC
  Administered 2015-05-03 – 2015-05-05 (×3): 10 mg via ORAL
  Filled 2015-05-02 (×4): qty 1

## 2015-05-02 MED ORDER — LEVOFLOXACIN 500 MG PO TABS: 500.0000 mg | ORAL_TABLET | Freq: Every day | ORAL | Status: DC

## 2015-05-02 MED ORDER — ALBUTEROL SULFATE (2.5 MG/3ML) 0.083% IN NEBU
2.5000 mg | INHALATION_SOLUTION | Freq: Three times a day (TID) | RESPIRATORY_TRACT | Status: DC
  Administered 2015-05-03: 2.5 mg via RESPIRATORY_TRACT
  Filled 2015-05-02: qty 3

## 2015-05-02 MED ORDER — PANTOPRAZOLE SODIUM 40 MG PO TBEC
40.0000 mg | DELAYED_RELEASE_TABLET | Freq: Every day | ORAL | Status: DC
  Administered 2015-05-03 – 2015-05-05 (×3): 40 mg via ORAL
  Filled 2015-05-02 (×3): qty 1

## 2015-05-02 MED ORDER — ASPIRIN EC 81 MG PO TBEC
81.0000 mg | DELAYED_RELEASE_TABLET | Freq: Every day | ORAL | Status: DC
  Administered 2015-05-03 – 2015-05-05 (×3): 81 mg via ORAL
  Filled 2015-05-02 (×3): qty 1

## 2015-05-02 MED ORDER — MAGNESIUM SULFATE 2 GM/50ML IV SOLN
2.0000 g | Freq: Once | INTRAVENOUS | Status: AC
  Administered 2015-05-02: 2 g via INTRAVENOUS
  Filled 2015-05-02: qty 50

## 2015-05-02 MED ORDER — PREDNISONE 50 MG PO TABS
60.0000 mg | ORAL_TABLET | Freq: Once | ORAL | Status: AC
  Administered 2015-05-02: 60 mg via ORAL
  Filled 2015-05-02: qty 1

## 2015-05-02 MED ORDER — LEVOFLOXACIN 250 MG PO TABS
250.0000 mg | ORAL_TABLET | Freq: Every day | ORAL | Status: DC
  Administered 2015-05-03 – 2015-05-04 (×2): 250 mg via ORAL
  Filled 2015-05-02 (×2): qty 1

## 2015-05-02 MED ORDER — HEPARIN SODIUM (PORCINE) 5000 UNIT/ML IJ SOLN
5000.0000 [IU] | Freq: Three times a day (TID) | INTRAMUSCULAR | Status: DC
  Administered 2015-05-03 – 2015-05-05 (×7): 5000 [IU] via SUBCUTANEOUS
  Filled 2015-05-02 (×7): qty 1

## 2015-05-02 MED ORDER — METHYLPREDNISOLONE SODIUM SUCC 125 MG IJ SOLR: 125.0000 mg | Freq: Four times a day (QID) | INTRAMUSCULAR | Status: DC

## 2015-05-02 MED ORDER — LOSARTAN POTASSIUM 50 MG PO TABS
50.0000 mg | ORAL_TABLET | Freq: Every day | ORAL | Status: DC
  Filled 2015-05-02: qty 1

## 2015-05-02 NOTE — ED Provider Notes (Signed)
CSN: [id number]     Arrival date & time 05/02/15  1720 History   First MD Initiated Contact with Patient 05/02/15 1751     Chief Complaint  Patient presents with  . Shortness of Breath     (Consider location/radiation/quality/duration/timing/severity/associated sxs/prior Treatment) HPI Comments: Patient reports 4 day history of cough and shortness of breath. History of COPD using Symbicort twice daily and albuterol 3 times daily. Reports cough productive of yellow mucus. Nurse's constant chest heaviness that is worsened with coughing. Did travel by car to the beach recently. Breathing seemed to get worse after that. Breathing is worse with exertion and better with rest. No difficulty lying flat. Chest tightness is constant. Clean catheterization in 2012. History of COPD, hypertension and GERD. States no previous admissions for COPD.  The history is provided by the patient.    Past Medical History  Diagnosis Date  . Chest pain     Cardiac catheterization 7/12: Normal coronary arteries, EF 60-65%, question mid LVOT obstruction with a 10 mm mercury gradient;   echo 7/12: Mild LVH, EF 55-60%, grade 2 diastolic dysfunction, mild LAE, PAS P. 29  . COPD (chronic obstructive pulmonary disease)   . Hypertension   . GERD (gastroesophageal reflux disease)   . Heart palpitations     Event monitor 8/12  . LBBB (left bundle branch block)    Past Surgical History  Procedure Laterality Date  . Cardiac catheterization  06/30/2011    Est. EF of 60-65% with normal coronary arteries --   . Abdominal hysterectomy    . Cholecystectomy    . Cervical spine surgery    . Cataract extraction     Family History  Problem Relation Age of Onset  . Acute lymphoblastic leukemia Maternal Grandfather   . Heart attack Father 34    CABG  . Hypertension Mother    History  Substance Use Topics  . Smoking status: Former Smoker -- 1.00 packs/day for 50 years    Types: Cigarettes    Quit date: 09/17/2008  .  Smokeless tobacco: Not on file  . Alcohol Use: No   OB History    No data available     Review of Systems  Constitutional: Positive for fatigue. Negative for fever, activity change and appetite change.  HENT: Negative for congestion and rhinorrhea.   Respiratory: Positive for cough, chest tightness and shortness of breath.   Cardiovascular: Positive for chest pain.  Gastrointestinal: Negative for nausea, vomiting and abdominal pain.  Genitourinary: Negative for dysuria, hematuria, vaginal bleeding and vaginal discharge.  Musculoskeletal: Negative for myalgias and arthralgias.  Skin: Negative for rash.  Neurological: Negative for dizziness, weakness and headaches.  A complete 10 system review of systems was obtained and all systems are negative except as noted in the HPI and PMH.      Allergies  Tagamet  Home Medications   Prior to Admission medications   Medication Sig Start Date End Date Taking? Authorizing Provider  albuterol (PROVENTIL) (2.5 MG/3ML) 0.083% nebulizer solution Take 2.5 mg by nebulization 3 (three) times daily.    Yes Historical Provider, MD  ALPRAZolam Prudy Feeler) 0.5 MG tablet Take 0.5 mg by mouth daily as needed for anxiety or sleep.    Yes Historical Provider, MD  aspirin 81 MG tablet Take 81 mg by mouth daily.     Yes Historical Provider, MD  budesonide-formoterol (SYMBICORT) 160-4.5 MCG/ACT inhaler Inhale 2 puffs into the lungs 2 (two) times daily.     Yes Historical Provider, MD  citalopram (CELEXA) 10 MG tablet Take 10 mg by mouth daily.     Yes Historical Provider, MD  dextromethorphan-guaiFENesin (MUCINEX DM) 30-600 MG per 12 hr tablet Take 1 tablet by mouth 2 (two) times daily as needed for cough (and for congestion).   Yes Historical Provider, MD  HYDROcodone-acetaminophen (NORCO/VICODIN) 5-325 MG per tablet Take 1 tablet by mouth every 6 (six) hours as needed for moderate pain.   Yes Historical Provider, MD  losartan-hydrochlorothiazide (HYZAAR) 100-12.5  MG per tablet Take 0.5 tablets by mouth daily.    Yes Historical Provider, MD  metoprolol succinate (TOPROL-XL) 50 MG 24 hr tablet Take 1 tablet (50 mg total) by mouth daily. Take with or immediately following a meal. 02/04/15  Yes Lennette Bihari, MD  omeprazole (PRILOSEC) 20 MG capsule Take 20 mg by mouth 2 (two) times daily before a meal.  12/17/14  Yes Historical Provider, MD  simvastatin (ZOCOR) 10 MG tablet Take 10 mg by mouth every evening.    Yes Historical Provider, MD  tiotropium (SPIRIVA) 18 MCG inhalation capsule Place 18 mcg into inhaler and inhale every evening.    Yes Historical Provider, MD   BP 128/50 mmHg  Pulse 77  Temp(Src) 98 F (36.7 C) (Oral)  Resp 16  Ht 5' (1.524 m)  Wt 159 lb (72.122 kg)  BMI 31.05 kg/m2  SpO2 95% Physical Exam  Constitutional: She is oriented to person, place, and time. She appears well-developed and well-nourished. No distress.  Dyspneic with conversation  HENT:  Head: Normocephalic and atraumatic.  Mouth/Throat: Oropharynx is clear and moist. No oropharyngeal exudate.  Eyes: Conjunctivae and EOM are normal. Pupils are equal, round, and reactive to light.  Neck: Normal range of motion. Neck supple.  No meningismus.  Cardiovascular: Normal rate, regular rhythm, normal heart sounds and intact distal pulses.   No murmur heard. Pulmonary/Chest: Effort normal. No respiratory distress. She has wheezes.  Decreased breath sounds throughout, scattered expiratory wheezing  Abdominal: Soft. There is no tenderness. There is no rebound and no guarding.  Musculoskeletal: Normal range of motion. She exhibits no edema or tenderness.  Neurological: She is alert and oriented to person, place, and time. No cranial nerve deficit. She exhibits normal muscle tone. Coordination normal.  No ataxia on finger to nose bilaterally. No pronator drift. 5/5 strength throughout. CN 2-12 intact. Negative Romberg. Equal grip strength. Sensation intact. Gait is normal.   Skin:  Skin is warm.  Psychiatric: She has a normal mood and affect. Her behavior is normal.  Nursing note and vitals reviewed.   ED Course  Procedures (including critical care time) Labs Review Labs Reviewed  BASIC METABOLIC PANEL - Abnormal; Notable for the following:    Chloride 100 (*)    BUN 22 (*)    Creatinine, Ser 1.35 (*)    Calcium 8.7 (*)    GFR calc non Af Amer 38 (*)    GFR calc Af Amer 44 (*)    All other components within normal limits  CBC WITH DIFFERENTIAL/PLATELET  TROPONIN I  BRAIN NATRIURETIC PEPTIDE  D-DIMER, QUANTITATIVE (NOT AT [facility])  COMPREHENSIVE METABOLIC PANEL  CBC    Imaging Review Dg Chest Portable 1 View  05/02/2015   CLINICAL DATA:  Shortness of breath and cough.  EXAM: PORTABLE CHEST - 1 VIEW  COMPARISON:  None.  FINDINGS: The heart size and mediastinal contours are within normal limits. Both lungs are clear. The visualized skeletal structures are unremarkable.  IMPRESSION: No active disease.  Electronically Signed   By: Signa Kellaylor  Stroud M.D.   On: 05/02/2015 18:31     EKG Interpretation None      MDM   Final diagnoses:  COPD exacerbation   Shortness of breath with cough, history of COPD. Recent car travel.   Nebs, steroids, magnesium  Chest xray negative. D-dimer negative. Troponin negative.  Ambulates without desaturation but becomes tachypneic.  Still wheezing on exam with tachypnea.  Wil admit for further therapy.  O2 saturations 88 at rest. Doubt ACS or PE.    ED ECG REPORT   Date: 05/02/2015  Rate: 75  Rhythm: normal sinus rhythm  QRS Axis: left  Intervals: normal  ST/T Wave abnormalities: nonspecific ST/T changes  Conduction Disutrbances:left bundle branch block  Narrative Interpretation:   Old EKG Reviewed: unchanged  I have personally reviewed the EKG tracing and agree with the computerized printout as noted.   Glynn OctaveStephen Arlee Santosuosso, MD 05/02/15 [phone]

## 2015-05-02 NOTE — ED Notes (Signed)
Hospitalist at bedside 

## 2015-05-02 NOTE — Progress Notes (Signed)
MEDICATION RELATED CONSULT NOTE - INITIAL   Pharmacy Consult for renal adjustment of levofloxacin Indication: COPD exacerbation  Allergies  Allergen Reactions  . Tagamet [Cimetidine] Hives and Itching    Patient Measurements: Height: 5' (152.4 cm) Weight: 159 lb (72.122 kg) IBW/kg (Calculated) : 45.5   Vital Signs: Temp: 98 F (36.7 C) (06/02 1744) Temp Source: Oral (06/02 1744) BP: 128/50 mmHg (06/02 2200) Pulse Rate: 77 (06/02 2200) Intake/Output from previous day:   Intake/Output from this shift:    Labs:  Recent Labs  05/02/15 1818  WBC 9.6  HGB 12.0  HCT 37.9  PLT 329  CREATININE 1.35*   Estimated Creatinine Clearance: 32.9 mL/min (by C-G formula based on Cr of 1.35).   Microbiology: No results found for this or any previous visit (from the past 720 hour(s)).  Medical History: Past Medical History  Diagnosis Date  . Chest pain     Cardiac catheterization 7/12: Normal coronary arteries, EF 60-65%, question mid LVOT obstruction with a 10 mm mercury gradient;   echo 7/12: Mild LVH, EF 55-60%, grade 2 diastolic dysfunction, mild LAE, PAS P. 29  . COPD (chronic obstructive pulmonary disease)   . Hypertension   . GERD (gastroesophageal reflux disease)   . Heart palpitations     Event monitor 8/12  . LBBB (left bundle branch block)     Medications:  Prescriptions prior to admission  Medication Sig Dispense Refill Last Dose  . albuterol (PROVENTIL) (2.5 MG/3ML) 0.083% nebulizer solution Take 2.5 mg by nebulization 3 (three) times daily.    05/02/2015 at Unknown time  . ALPRAZolam (XANAX) 0.5 MG tablet Take 0.5 mg by mouth daily as needed for anxiety or sleep.    05/02/2015 at 9-1000a  . aspirin 81 MG tablet Take 81 mg by mouth daily.     05/02/2015 at Unknown time  . budesonide-formoterol (SYMBICORT) 160-4.5 MCG/ACT inhaler Inhale 2 puffs into the lungs 2 (two) times daily.     05/02/2015 at Unknown time  . citalopram (CELEXA) 10 MG tablet Take 10 mg by mouth  daily.     05/02/2015 at Unknown time  . dextromethorphan-guaiFENesin (MUCINEX DM) 30-600 MG per 12 hr tablet Take 1 tablet by mouth 2 (two) times daily as needed for cough (and for congestion).   Past Week at Unknown time  . HYDROcodone-acetaminophen (NORCO/VICODIN) 5-325 MG per tablet Take 1 tablet by mouth every 6 (six) hours as needed for moderate pain.   05/02/2015 at Unknown time  . losartan-hydrochlorothiazide (HYZAAR) 100-12.5 MG per tablet Take 0.5 tablets by mouth daily.    05/02/2015 at Unknown time  . metoprolol succinate (TOPROL-XL) 50 MG 24 hr tablet Take 1 tablet (50 mg total) by mouth daily. Take with or immediately following a meal. 30 tablet 6 05/02/2015 at 800a  . omeprazole (PRILOSEC) 20 MG capsule Take 20 mg by mouth 2 (two) times daily before a meal.    05/02/2015 at Unknown time  . simvastatin (ZOCOR) 10 MG tablet Take 10 mg by mouth every evening.    05/01/2015 at Unknown time  . tiotropium (SPIRIVA) 18 MCG inhalation capsule Place 18 mcg into inhaler and inhale every evening.    05/01/2015 at Unknown time    Assessment: 74 yo F admitted for COPD exacerbation with renal dysfunction -- CrCl ~33 ml/min.  Goal of Therapy:  Appropriate dosing  Plan:  - Levofloxacin 500 mg po x 1 then 250 mg po q24h - Further dosing per MD  Drusilla KannerGrimsley, Keno Caraway Lydia 05/02/2015,10:53  PM

## 2015-05-02 NOTE — ED Notes (Signed)
Pt. Ambulated in hallway to bathroom. Pt. O2 sats remained greater than 91%. Pt. Reported feeling short of breath while walking back to room. Pt. Assisted to bed. EDP notified.

## 2015-05-02 NOTE — ED Notes (Signed)
Pt reports cough productive at times for the past 3 weeks.  Reports SOB x 1 week.  Reports has COPD and has been using inhaler and nebulizer without relief.

## 2015-05-02 NOTE — ED Notes (Signed)
RT at bedside.

## 2015-05-02 NOTE — H&P (Signed)
[facility] History and Physical  Dorothy Bryant ZOX:[id number] DOB: 04/03/1941 DOA: 05/02/2015  Referring physician: ER, Dr. Lajean Saverrancor PCP: Estanislado PandySASSER,PAUL W, MD   Chief Complaint: Dyspnea  HPI: Dorothy Bryant is a 74 y.o. female  This lady of 74 years old gives a one-week history of dyspnea associated with cough, initially nonproductive but in the last day or 2 has been productive of yellow sputum. She denies fever. She is an ex-smoker, having quit smoking 8 years ago. Prior to that she had been smoking 3 pack of cigarettes per day for several years. She denies any chest pain, palpitations or limb weakness. She had been to the beach about a week ago when the symptoms started. She has had cardiac catheterization in 2012 which was normal. She does not have any home oxygen. She is now being admitted because of her COPD exacerbation has not been controlled in the emergency room.   Review of Systems:  Apart from symptoms above, all systems negative.  Past Medical History  Diagnosis Date  . Chest pain     Cardiac catheterization 7/12: Normal coronary arteries, EF 60-65%, question mid LVOT obstruction with a 10 mm mercury gradient;   echo 7/12: Mild LVH, EF 55-60%, grade 2 diastolic dysfunction, mild LAE, PAS P. 29  . COPD (chronic obstructive pulmonary disease)   . Hypertension   . GERD (gastroesophageal reflux disease)   . Heart palpitations     Event monitor 8/12  . LBBB (left bundle branch block)    Past Surgical History  Procedure Laterality Date  . Cardiac catheterization  06/30/2011    Est. EF of 60-65% with normal coronary arteries --   . Abdominal hysterectomy    . Cholecystectomy    . Cervical spine surgery    . Cataract extraction     Social History:  reports that she quit smoking about 6 years ago. Her smoking use included Cigarettes. She has a 50 pack-year smoking history. She does not have any smokeless tobacco history on file. She reports that she does not drink alcohol or use  illicit drugs.  Allergies  Allergen Reactions  . Tagamet [Cimetidine] Hives and Itching    Family History  Problem Relation Age of Onset  . Acute lymphoblastic leukemia Maternal Grandfather   . Heart attack Father [id number]    CABG  . Hypertension Mother     Prior to Admission medications   Medication Sig Start Date End Date Taking? Authorizing Provider  albuterol (PROVENTIL) (2.5 MG/3ML) 0.083% nebulizer solution Take 2.5 mg by nebulization 3 (three) times daily.    Yes Historical Provider, MD  ALPRAZolam Prudy Feeler(XANAX) 0.5 MG tablet Take 0.5 mg by mouth daily as needed for anxiety or sleep.    Yes Historical Provider, MD  aspirin 81 MG tablet Take 81 mg by mouth daily.     Yes Historical Provider, MD  budesonide-formoterol (SYMBICORT) 160-4.5 MCG/ACT inhaler Inhale 2 puffs into the lungs 2 (two) times daily.     Yes Historical Provider, MD  citalopram (CELEXA) 10 MG tablet Take 10 mg by mouth daily.     Yes Historical Provider, MD  dextromethorphan-guaiFENesin (MUCINEX DM) 30-600 MG per 12 hr tablet Take 1 tablet by mouth 2 (two) times daily as needed for cough (and for congestion).   Yes Historical Provider, MD  HYDROcodone-acetaminophen (NORCO/VICODIN) 5-325 MG per tablet Take 1 tablet by mouth every 6 (six) hours as needed for moderate pain.   Yes Historical Provider, MD  losartan-hydrochlorothiazide (HYZAAR) 100-12.5 MG per tablet Take  0.5 tablets by mouth daily.    Yes Historical Provider, MD  metoprolol succinate (TOPROL-XL) 50 MG 24 hr tablet Take 1 tablet (50 mg total) by mouth daily. Take with or immediately following a meal. 02/04/15  Yes Lennette Bihari, MD  omeprazole (PRILOSEC) 20 MG capsule Take 20 mg by mouth 2 (two) times daily before a meal.  12/17/14  Yes Historical Provider, MD  simvastatin (ZOCOR) 10 MG tablet Take 10 mg by mouth every evening.    Yes Historical Provider, MD  tiotropium (SPIRIVA) 18 MCG inhalation capsule Place 18 mcg into inhaler and inhale every evening.    Yes  Historical Provider, MD   Physical Exam: Filed Vitals:   05/02/15 1930 05/02/15 2029 05/02/15 2030 05/02/15 2100  BP: 118/54  144/62 140/66  Pulse: 74  72 77  Temp:      TempSrc:      Resp: Height:      Weight:      SpO2: 90% 94% 96% 94%    Wt Readings from Last 3 Encounters:  05/02/15 72.122 kg (159 lb)  02/13/15 72.122 kg (159 lb)  01/09/15 72.576 kg (160 lb)    General:  Appears calm and comfortable. She is not in acute respiratory distress. There is no peripheral central cyanosis. Eyes: PERRL, normal lids, irises & conjunctiva ENT: grossly normal hearing, lips & tongue Neck: no LAD, masses or thyromegaly Cardiovascular: RRR, no m/r/g. No LE edema. Telemetry: SR, no arrhythmias  Respiratory: Bilateral wheezing, moderately tight. There are no crackles or bronchial breathing. Abdomen: soft, ntnd Skin: no rash or induration seen on limited exam Musculoskeletal: grossly normal tone BUE/BLE Psychiatric: grossly normal mood and affect, speech fluent and appropriate Neurologic: grossly non-focal.          Labs on Admission:  Basic Metabolic Panel:  Recent Labs Lab 05/02/15 1818  NA 139  K 3.9  CL 100*  CO2 27  GLUCOSE 93  BUN 22*  CREATININE 1.35*  CALCIUM 8.7*   Liver Function Tests: No results for input(s): AST, ALT, ALKPHOS, BILITOT, PROT, ALBUMIN in the last 168 hours. No results for input(s): LIPASE, AMYLASE in the last 168 hours. No results for input(s): AMMONIA in the last 168 hours. CBC:  Recent Labs Lab 05/02/15 1818  WBC 9.6  NEUTROABS 5.5  HGB 12.0  HCT 37.9  MCV 89.2  PLT 329   Cardiac Enzymes:  Recent Labs Lab 05/02/15 1818  TROPONINI <0.03    BNP (last 3 results)  Recent Labs  05/02/15 1818  BNP 60.0    ProBNP (last 3 results) No results for input(s): PROBNP in the last 8760 hours.  CBG: No results for input(s): GLUCAP in the last 168 hours.  Radiological Exams on Admission: Dg Chest Portable 1  View  05/02/2015   CLINICAL DATA:  Shortness of breath and cough.  EXAM: PORTABLE CHEST - 1 VIEW  COMPARISON:  None.  FINDINGS: The heart size and mediastinal contours are within normal limits. Both lungs are clear. The visualized skeletal structures are unremarkable.  IMPRESSION: No active disease.   Electronically Signed   By: Signa Kell M.D.   On: 05/02/2015 18:31      Assessment/Plan   1. COPD exacerbation. She has not been helped by her inhalers at home. She will need admission for intravenous pterygoids and oral anti-biotic's. Fortunately, there does not appear to be a pneumonia on chest x-ray. She may require home oxygen once she is improved and  testing is done. 2. Hypertension. Stable. Continue with home medications. 3. Renal insufficiency. I do not know her baseline creatinine but she does look clinically dehydrated. She will get IV fluids and we will monitor renal function closely.  Further recommendations will depend on patient's hospital progress.   Code Status: Full code.  DVT Prophylaxis: Heparin.  Family Communication: I discussed the plan with the patient at the bedside.   Disposition Plan: Home when medically stable.  Time spent: 60 minutes.  Wilson Singer [facility] Pager [phone].

## 2015-05-03 DIAGNOSIS — E785 Hyperlipidemia, unspecified: Secondary | ICD-10-CM

## 2015-05-03 DIAGNOSIS — J9601 Acute respiratory failure with hypoxia: Secondary | ICD-10-CM | POA: Diagnosis present

## 2015-05-03 DIAGNOSIS — I10 Essential (primary) hypertension: Secondary | ICD-10-CM

## 2015-05-03 DIAGNOSIS — J441 Chronic obstructive pulmonary disease with (acute) exacerbation: Principal | ICD-10-CM

## 2015-05-03 LAB — COMPREHENSIVE METABOLIC PANEL
ALBUMIN: 3.5 g/dL (ref 3.5–5.0)
ALT: 23 U/L (ref 14–54)
AST: 19 U/L (ref 15–41)
Alkaline Phosphatase: 67 U/L (ref 38–126)
Anion gap: 9 (ref 5–15)
BUN: 19 mg/dL (ref 6–20)
CALCIUM: 8.2 mg/dL — AB (ref 8.9–10.3)
CHLORIDE: 104 mmol/L (ref 101–111)
CO2: 27 mmol/L (ref 22–32)
CREATININE: 0.99 mg/dL (ref 0.44–1.00)
GFR calc Af Amer: >60 mL/min (ref >60)
GFR calc non Af Amer: 55 mL/min — ABNORMAL LOW (ref >60)
GLUCOSE: 169 mg/dL — AB (ref 65–99)
Potassium: 3.9 mmol/L (ref 3.5–5.1)
Sodium: 140 mmol/L (ref 135–145)
TOTAL PROTEIN: 6.6 g/dL (ref 6.5–8.1)
Total Bilirubin: 0.4 mg/dL (ref 0.3–1.2)

## 2015-05-03 LAB — CBC
HCT: 39.1 % (ref 36.0–46.0)
Hemoglobin: 12.3 g/dL (ref 12.0–15.0)
MCH: 28 pg (ref 26.0–34.0)
MCHC: 31.5 g/dL (ref 30.0–36.0)
MCV: 88.9 fL (ref 78.0–100.0)
PLATELETS: 324 10*3/uL (ref 150–400)
RBC: 4.4 MIL/uL (ref 3.87–5.11)
RDW: 14.4 % (ref 11.5–15.5)
WBC: 7.6 10*3/uL (ref 4.0–10.5)

## 2015-05-03 MED ORDER — CETYLPYRIDINIUM CHLORIDE 0.05 % MT LIQD
7.0000 mL | Freq: Two times a day (BID) | OROMUCOSAL | Status: DC
  Administered 2015-05-03 – 2015-05-04 (×4): 7 mL via OROMUCOSAL

## 2015-05-03 MED ORDER — LOSARTAN POTASSIUM 50 MG PO TABS
50.0000 mg | ORAL_TABLET | Freq: Every day | ORAL | Status: DC
  Administered 2015-05-03 – 2015-05-05 (×3): 50 mg via ORAL
  Filled 2015-05-03 (×3): qty 1

## 2015-05-03 MED ORDER — ALBUTEROL SULFATE (2.5 MG/3ML) 0.083% IN NEBU: 2.5000 mg | INHALATION_SOLUTION | RESPIRATORY_TRACT | Status: DC | PRN

## 2015-05-03 MED ORDER — IPRATROPIUM-ALBUTEROL 0.5-2.5 (3) MG/3ML IN SOLN
3.0000 mL | RESPIRATORY_TRACT | Status: DC
  Administered 2015-05-03 – 2015-05-05 (×10): 3 mL via RESPIRATORY_TRACT
  Filled 2015-05-03 (×10): qty 3

## 2015-05-03 MED ORDER — METHYLPREDNISOLONE SODIUM SUCC 125 MG IJ SOLR
60.0000 mg | Freq: Four times a day (QID) | INTRAMUSCULAR | Status: DC
  Administered 2015-05-03 – 2015-05-04 (×4): 60 mg via INTRAVENOUS
  Filled 2015-05-03 (×5): qty 2

## 2015-05-03 MED ORDER — HYDROCHLOROTHIAZIDE 10 MG/ML ORAL SUSPENSION
6.2500 mg | Freq: Every day | ORAL | Status: DC
  Administered 2015-05-03 – 2015-05-04 (×2): 6.25 mg via ORAL
  Filled 2015-05-03 (×4): qty 1.25

## 2015-05-03 MED ORDER — GUAIFENESIN ER 600 MG PO TB12
1200.0000 mg | ORAL_TABLET | Freq: Two times a day (BID) | ORAL | Status: DC
  Administered 2015-05-03 – 2015-05-05 (×5): 1200 mg via ORAL
  Filled 2015-05-03 (×5): qty 2

## 2015-05-03 NOTE — Progress Notes (Signed)
UR chart review completed.  

## 2015-05-03 NOTE — Progress Notes (Addendum)
[facility] PROGRESS NOTE  Dorothy Bryant RUE:[id number] DOB: 1941/07/03 DOA: 05/02/2015 PCP: Estanislado PandySASSER,PAUL W, MD  Assessment/Plan: 1. Acute respiratory failure. Related to COPD exacerbation. Continue to wean off oxygen as tolerated. 2. COPD exacerbation. Patient continues to be short of breath. Continue intravenous steroids, antibiotics and broncho-dilators. 3. Hypertension. Continue Toprol and Hyzaar. Blood pressure stable. 4. Hyperlipidemia. Continue statin. 5. GERD. Continue proton pump inhibitors.  Code Status: full code Family Communication: discussed with patient and daughter at the bedside Disposition Plan: discharge home once improved   Consultants:    Procedures:    Antibiotics:  Levofloxacin 6/2>>  HPI/Subjective: Has non productive cough. Wheezing improving. Shortness of breath improving.  Objective: Filed Vitals:   05/03/15 1108  BP: 157/65  Pulse: 91  Temp:   Resp:     Intake/Output Summary (Last 24 hours) at 05/03/15 1310 Last data filed at 05/03/15 1112  Gross per 24 hour  Intake    363 ml  Output   1000 ml  Net   -637 ml   Filed Weights   05/02/15 1744 05/02/15 2258  Weight: 72.122 kg (159 lb) 70.58 kg (155 lb 9.6 oz)    Exam:   General:  NAD  Cardiovascular: s1, s2, rrr  Respiratory: diminished breath sounds with scattered rhonchi  Abdomen: soft, nt, nd, bs+  Musculoskeletal: no edema b/l   Data Reviewed: Basic Metabolic Panel:  Recent Labs Lab 05/02/15 1818 05/03/15 0641  NA 139 140  K 3.9 3.9  CL 100* 104  CO2 27 27  GLUCOSE 93 169*  BUN 22* 19  CREATININE 1.35* 0.99  CALCIUM 8.7* 8.2*   Liver Function Tests:  Recent Labs Lab 05/03/15 0641  AST 19  ALT 23  ALKPHOS 67  BILITOT 0.4  PROT 6.6  ALBUMIN 3.5   No results for input(s): LIPASE, AMYLASE in the last 168 hours. No results for input(s): AMMONIA in the last 168 hours. CBC:  Recent Labs Lab 05/02/15 1818 05/03/15 0641  WBC 9.6 7.6  NEUTROABS  5.5  --   HGB 12.0 12.3  HCT 37.9 39.1  MCV 89.2 88.9  PLT 329 324   Cardiac Enzymes:  Recent Labs Lab 05/02/15 1818  TROPONINI <0.03   BNP (last 3 results)  Recent Labs  05/02/15 1818  BNP 60.0    ProBNP (last 3 results) No results for input(s): PROBNP in the last 8760 hours.  CBG: No results for input(s): GLUCAP in the last 168 hours.  No results found for this or any previous visit (from the past 240 hour(s)).   Studies: Dg Chest Portable 1 View  05/02/2015   CLINICAL DATA:  Shortness of breath and cough.  EXAM: PORTABLE CHEST - 1 VIEW  COMPARISON:  None.  FINDINGS: The heart size and mediastinal contours are within normal limits. Both lungs are clear. The visualized skeletal structures are unremarkable.  IMPRESSION: No active disease.   Electronically Signed   By: Signa Kellaylor  Stroud M.D.   On: 05/02/2015 18:31    Scheduled Meds: . antiseptic oral rinse  7 mL Mouth Rinse BID  . aspirin EC  81 mg Oral Daily  . budesonide-formoterol  2 puff Inhalation BID  . citalopram  10 mg Oral Daily  . guaiFENesin  1,200 mg Oral BID  . heparin  5,000 Units Subcutaneous 3 times per day  . hydrochlorothiazide  6.25 mg Oral Daily   And  . losartan  50 mg Oral Daily  . ipratropium-albuterol  3 mL Nebulization Q4H  .  levofloxacin  250 mg Oral QHS  . methylPREDNISolone (SOLU-MEDROL) injection  60 mg Intravenous Q6H  . metoprolol succinate  50 mg Oral Daily  . pantoprazole  40 mg Oral Daily  . simvastatin  10 mg Oral QPM  . sodium chloride  3 mL Intravenous Q12H   Continuous Infusions: . sodium chloride      Active Problems:   Hypertension   COPD exacerbation    Time spent:    Bryant,Dorothy  [facility] Pager [phone]. If 7PM-7AM, please contact night-coverage at [email or web address], password [name] 05/03/2015, 1:10 PM  LOS: 1 day

## 2015-05-03 NOTE — Care Management Note (Signed)
Case Management Note  Patient Details  Name: Dorothy Bryant MRN: [id number] Date of Birth: 23-Nov-1941  Subjective/Objective:                  Pt admitted from home with COPD exacerbation. Pt lives alone and will return home at discharge. Pt is independent with ADL's. Pt has a neb machine for home use.  Action/Plan: Will need home O2 assessment prior to discharge. If pt qualifies for home O2 would like to get O2 from [facility]. Weekend staff to arrange if discharges over the weekend.  Expected Discharge Date:                  Expected Discharge Plan:  Home/Self Care  In-House Referral:  NA  Discharge planning Services  CM Consult  Post Acute Care Choice:  Durable Medical Equipment Choice offered to:  Patient  DME Arranged:    DME Agency:  [facility]  HH Arranged:    HH Agency:     Status of Service:  Completed, signed off  Medicare Important Message Given:  Yes Date Medicare IM Given:  05/03/15 Medicare IM give by:  Arlyss Queenammy Braelen Sproule, RN BSN CM Date Additional Medicare IM Given:    Additional Medicare Important Message give by:     If discussed at Long Length of Stay Meetings, dates discussed:    Additional Comments:  Dorothy Bryant, Dorothy Cadmus Crowder, RN 05/03/2015, 11:08 AM

## 2015-05-03 NOTE — Progress Notes (Signed)
O2 sat on RA @ rest 93 O2 sat on RA during ambulation 90 O2 sat on RA at rest after ambulation 91  Please note that during ambulation pt experienced slight discomfort. Pt stated she felt light headed so after approximately 24100ft pt returned to her room. Pt is now resting comfortable and in no acute distress. O2 sat on 2L via nasal cannula 95

## 2015-05-04 ENCOUNTER — Inpatient Hospital Stay (HOSPITAL_COMMUNITY): Payer: Medicare Other

## 2015-05-04 DIAGNOSIS — R079 Chest pain, unspecified: Secondary | ICD-10-CM

## 2015-05-04 LAB — TROPONIN I

## 2015-05-04 MED ORDER — METHYLPREDNISOLONE SODIUM SUCC 125 MG IJ SOLR
60.0000 mg | Freq: Two times a day (BID) | INTRAMUSCULAR | Status: DC
  Administered 2015-05-04 – 2015-05-05 (×2): 60 mg via INTRAVENOUS
  Filled 2015-05-04 (×2): qty 2

## 2015-05-04 NOTE — Progress Notes (Signed)
[facility] PROGRESS NOTE  Dorothy GrahamJoyce Bryant ZOX:[id number] DOB: 12/19/1940 DOA: 05/02/2015 PCP: Estanislado PandySASSER,PAUL W, MD  Assessment/Plan: 1. Acute respiratory failure. Related to COPD exacerbation. Continue to wean off oxygen as tolerated. 2. COPD exacerbation. Feels shortness of breath is improving. Will start to taper steroids. Continue antibiotics and broncho-dilators. 3. Hypertension. Continue Toprol and Hyzaar. Blood pressure stable. 4. Hyperlipidemia. Continue statin. 5. GERD. Continue proton pump inhibitors. 6. Chest pain, ekg independently reviewed and does not show any new changes. Troponin negative and chest xray without acute findings. Continue to monitor.  Code Status: full code Family Communication: discussed with patient at the bedside Disposition Plan: discharge home once improved   Consultants:    Procedures:    Antibiotics:  Levofloxacin 6/2>>  HPI/Subjective: Describes having chest pain this morning. Non radiating heaviness in chest that began when she was standing at the sink. Feels overall shortness of breath is getting better.  Objective: Filed Vitals:   05/04/15 1500  BP: 153/72  Pulse: 97  Temp: 97.6 F (36.4 C)  Resp: 18    Intake/Output Summary (Last 24 hours) at 05/04/15 1635 Last data filed at 05/04/15 1200  Gross per 24 hour  Intake    720 ml  Output   2750 ml  Net  -2030 ml   Filed Weights   05/02/15 1744 05/02/15 2258  Weight: 72.122 kg (159 lb) 70.58 kg (155 lb 9.6 oz)    Exam:   General:  NAD  Cardiovascular: s1, s2, regular  Respiratory: clear bilaterally, no wheezing  Abdomen: soft, nt, nd, bs+  Musculoskeletal: no edema b/l   Data Reviewed: Basic Metabolic Panel:  Recent Labs Lab 05/02/15 1818 05/03/15 0641  NA 139 140  K 3.9 3.9  CL 100* 104  CO2 27 27  GLUCOSE 93 169*  BUN 22* 19  CREATININE 1.35* 0.99  CALCIUM 8.7* 8.2*   Liver Function Tests:  Recent Labs Lab 05/03/15 0641  AST 19  ALT 23   ALKPHOS 67  BILITOT 0.4  PROT 6.6  ALBUMIN 3.5   No results for input(s): LIPASE, AMYLASE in the last 168 hours. No results for input(s): AMMONIA in the last 168 hours. CBC:  Recent Labs Lab 05/02/15 1818 05/03/15 0641  WBC 9.6 7.6  NEUTROABS 5.5  --   HGB 12.0 12.3  HCT 37.9 39.1  MCV 89.2 88.9  PLT 329 324   Cardiac Enzymes:  Recent Labs Lab 05/02/15 1818 05/04/15 1045  TROPONINI <0.03 <0.03   BNP (last 3 results)  Recent Labs  05/02/15 1818  BNP 60.0    ProBNP (last 3 results) No results for input(s): PROBNP in the last 8760 hours.  CBG: No results for input(s): GLUCAP in the last 168 hours.  No results found for this or any previous visit (from the past 240 hour(s)).   Studies: Dg Chest Port 1 View  05/04/2015   CLINICAL DATA:  Pt stated she woke up this morning to brush her teeth and felt a "uncomfortable pressure" in her mid chest region. Pt states it was around 8 this morning and she tried to wait it out until the pain subsided but said she was worried about it so she called the nurse. Pt denies any injury.  EXAM: PORTABLE CHEST - 1 VIEW  COMPARISON:  05/02/2015  FINDINGS: Cardiac silhouette normal in size and configuration. Normal mediastinal and hilar contours.  Lungs are mildly hyperexpanded but clear. No pleural effusion or pneumothorax.  Bony thorax is grossly intact. There stable changes  from a previous low anterior cervical spine fusion.  IMPRESSION: No acute cardiopulmonary disease.   Electronically Signed   By: Amie Portland M.D.   On: 05/04/2015 11:55   Dg Chest Portable 1 View  05/02/2015   CLINICAL DATA:  Shortness of breath and cough.  EXAM: PORTABLE CHEST - 1 VIEW  COMPARISON:  None.  FINDINGS: The heart size and mediastinal contours are within normal limits. Both lungs are clear. The visualized skeletal structures are unremarkable.  IMPRESSION: No active disease.   Electronically Signed   By: Signa Kell M.D.   On: 05/02/2015 18:31     Scheduled Meds: . antiseptic oral rinse  7 mL Mouth Rinse BID  . aspirin EC  81 mg Oral Daily  . budesonide-formoterol  2 puff Inhalation BID  . citalopram  10 mg Oral Daily  . guaiFENesin  1,200 mg Oral BID  . heparin  5,000 Units Subcutaneous 3 times per day  . hydrochlorothiazide  6.25 mg Oral Daily   And  . losartan  50 mg Oral Daily  . ipratropium-albuterol  3 mL Nebulization Q4H  . levofloxacin  250 mg Oral QHS  . methylPREDNISolone (SOLU-MEDROL) injection  60 mg Intravenous Q12H  . metoprolol succinate  50 mg Oral Daily  . pantoprazole  40 mg Oral Daily  . simvastatin  10 mg Oral QPM  . sodium chloride  3 mL Intravenous Q12H   Continuous Infusions:    Active Problems:   Hypertension   COPD exacerbation   Acute respiratory failure with hypoxia    Time spent:    Khaliq Turay  [facility] Pager [phone]. If 7PM-7AM, please contact night-coverage at [email or web address], password [facility] 05/04/2015, 4:35 PM  LOS: 2 days

## 2015-05-05 DIAGNOSIS — R079 Chest pain, unspecified: Secondary | ICD-10-CM | POA: Insufficient documentation

## 2015-05-05 MED ORDER — LEVOFLOXACIN 250 MG PO TABS: 250.0000 mg | ORAL_TABLET | Freq: Every day | ORAL | Status: DC

## 2015-05-05 MED ORDER — GUAIFENESIN ER 600 MG PO TB12: 1200.0000 mg | ORAL_TABLET | Freq: Two times a day (BID) | ORAL | Status: DC

## 2015-05-05 MED ORDER — PREDNISONE 10 MG PO TABS: ORAL_TABLET | ORAL | Status: DC

## 2015-05-05 NOTE — Discharge Summary (Signed)
Physician Discharge Summary  Dorothy Bryant:[id number] DOB: 1941-01-17 DOA: 05/02/2015  PCP: Estanislado PandySASSER,PAUL W, MD  Admit date: 05/02/2015 Discharge date: 05/05/2015  Time spent: 40 minutes  Recommendations for Outpatient Follow-up:  1. Follow up with primary care physician in 1-2 weeks  Discharge Diagnoses:  Active Problems:   Hypertension   COPD exacerbation   Acute respiratory failure with hypoxia   Chest pain  hyperlipidemia GERD  Discharge Condition: improved  Diet recommendation: low salt  Filed Weights   05/02/15 1744 05/02/15 2258  Weight: 72.122 kg (159 lb) 70.58 kg (155 lb 9.6 oz)    History of present illness:  This patient presents to the hospital with progressive shortness of breath and cough productive of yellow sputum. She denied any fever. She has known COPD and was admitted for treatment of COPD exacerbation.  Hospital Course:  Patient was started on treatment for COPD exacerbation with intravenous steroids, bronchi dilators and antibiotic. The patient began to improve and has not returned to her baseline level of functioning. She is able to ambulate in the hall while maintaining oxygen saturations greater than 90%. She is no longer wheezing. She's been transitioned to prednisone taper. She already has a nebulizer at home. She briefly had chest pain during her hospital stay, but EKG did not show any new findings, troponin was negative and chest x-ray did not show any new findings. The patient is feeling significantly improved at this time. She will discharge home today and follow-up with her primary care physician as well as her pulmonologist.  Procedures:    Consultations:    Discharge Exam: Filed Vitals:   05/05/15 0521  BP: 144/65  Pulse: 86  Temp: 98.4 F (36.9 C)  Resp: 20    General: NAD Cardiovascular: S1, s2 RRR Respiratory: CTA B  Discharge Instructions   Discharge Instructions    Diet - low sodium heart healthy    Complete by:  As directed       Increase activity slowly    Complete by:  As directed           Discharge Medication List as of 05/05/2015 10:49 AM    START taking these medications   Details  guaiFENesin (MUCINEX) 600 MG 12 hr tablet Take 2 tablets (1,200 mg total) by mouth 2 (two) times daily., Starting 05/05/2015, Until Discontinued, Print    levofloxacin (LEVAQUIN) 250 MG tablet Take 1 tablet (250 mg total) by mouth at bedtime., Starting 05/05/2015, Until Discontinued, Print    predniSONE (DELTASONE) 10 MG tablet Take 40mg  po daily for 2 days then 30mg  po daily for 2 days then 20mg  po daily for 2 days then 10mg  po daily for 2 days then stop, Print      CONTINUE these medications which have NOT CHANGED   Details  albuterol (PROVENTIL) (2.5 MG/3ML) 0.083% nebulizer solution Take 2.5 mg by nebulization 3 (three) times daily. , Until Discontinued, Historical Med    ALPRAZolam (XANAX) 0.5 MG tablet Take 0.5 mg by mouth daily as needed for anxiety or sleep. , Until Discontinued, Historical Med    aspirin 81 MG tablet Take 81 mg by mouth daily.  , Until Discontinued, Historical Med    budesonide-formoterol (SYMBICORT) 160-4.5 MCG/ACT inhaler Inhale 2 puffs into the lungs 2 (two) times daily.  , Until Discontinued, Historical Med    citalopram (CELEXA) 10 MG tablet Take 10 mg by mouth daily.  , Until Discontinued, Historical Med    dextromethorphan-guaiFENesin (MUCINEX DM) 30-600 MG per 12 hr  tablet Take 1 tablet by mouth 2 (two) times daily as needed for cough (and for congestion)., Until Discontinued, Historical Med    HYDROcodone-acetaminophen (NORCO/VICODIN) 5-325 MG per tablet Take 1 tablet by mouth every 6 (six) hours as needed for moderate pain., Until Discontinued, Historical Med    losartan-hydrochlorothiazide (HYZAAR) 100-12.5 MG per tablet Take 0.5 tablets by mouth daily. , Until Discontinued, Historical Med    metoprolol succinate (TOPROL-XL) 50 MG 24 hr tablet Take 1 tablet (50 mg total) by mouth daily.  Take with or immediately following a meal., Starting 02/04/2015, Until Discontinued, Normal    omeprazole (PRILOSEC) 20 MG capsule Take 20 mg by mouth 2 (two) times daily before a meal. , Starting 12/17/2014, Until Discontinued, Historical Med    simvastatin (ZOCOR) 10 MG tablet Take 10 mg by mouth every evening. , Until Discontinued, Historical Med    tiotropium (SPIRIVA) 18 MCG inhalation capsule Place 18 mcg into inhaler and inhale every evening. , Until Discontinued, Historical Med       Allergies  Allergen Reactions  . Tagamet [Cimetidine] Hives and Itching      The results of significant diagnostics from this hospitalization (including imaging, microbiology, ancillary and laboratory) are listed below for reference.    Significant Diagnostic Studies: Dg Chest Port 1 View  05/04/2015   CLINICAL DATA:  Pt stated she woke up this morning to brush her teeth and felt a "uncomfortable pressure" in her mid chest region. Pt states it was around 8 this morning and she tried to wait it out until the pain subsided but said she was worried about it so she called the nurse. Pt denies any injury.  EXAM: PORTABLE CHEST - 1 VIEW  COMPARISON:  05/02/2015  FINDINGS: Cardiac silhouette normal in size and configuration. Normal mediastinal and hilar contours.  Lungs are mildly hyperexpanded but clear. No pleural effusion or pneumothorax.  Bony thorax is grossly intact. There stable changes from a previous low anterior cervical spine fusion.  IMPRESSION: No acute cardiopulmonary disease.   Electronically Signed   By: Amie Portland M.D.   On: 05/04/2015 11:55   Dg Chest Portable 1 View  05/02/2015   CLINICAL DATA:  Shortness of breath and cough.  EXAM: PORTABLE CHEST - 1 VIEW  COMPARISON:  None.  FINDINGS: The heart size and mediastinal contours are within normal limits. Both lungs are clear. The visualized skeletal structures are unremarkable.  IMPRESSION: No active disease.   Electronically Signed   By: Signa Kell M.D.   On: 05/02/2015 18:31    Microbiology: No results found for this or any previous visit (from the past 240 hour(s)).   Labs: Basic Metabolic Panel:  Recent Labs Lab 05/02/15 1818 05/03/15 0641  NA 139 140  K 3.9 3.9  CL 100* 104  CO2 27 27  GLUCOSE 93 169*  BUN 22* 19  CREATININE 1.35* 0.99  CALCIUM 8.7* 8.2*   Liver Function Tests:  Recent Labs Lab 05/03/15 0641  AST 19  ALT 23  ALKPHOS 67  BILITOT 0.4  PROT 6.6  ALBUMIN 3.5   No results for input(s): LIPASE, AMYLASE in the last 168 hours. No results for input(s): AMMONIA in the last 168 hours. CBC:  Recent Labs Lab 05/02/15 1818 05/03/15 0641  WBC 9.6 7.6  NEUTROABS 5.5  --   HGB 12.0 12.3  HCT 37.9 39.1  MCV 89.2 88.9  PLT 329 324   Cardiac Enzymes:  Recent Labs Lab 05/02/15 1818 05/04/15 1045  TROPONINI <0.03 <0.03   BNP: BNP (last 3 results)  Recent Labs  05/02/15 1818  BNP 60.0    ProBNP (last 3 results) No results for input(s): PROBNP in the last 8760 hours.  CBG: No results for input(s): GLUCAP in the last 168 hours.     Signed:  MEMON,JEHANZEB  [facility] 05/05/2015, 12:25 PM

## 2015-05-05 NOTE — Progress Notes (Signed)
Discharge instruction reviewed with patient and patient's daughter. Prescriptions given to patient. No distress noted.

## 2015-05-05 NOTE — Progress Notes (Signed)
SATURATION QUALIFICATIONS: (This note is used to comply with regulatory documentation for home oxygen)  Patient Saturations on Room Air at Rest 94%  Patient Saturations on Room Air while Ambulating = 89-92% Patient Saturations on  Liters of oxygen while Ambulating %  Please briefly explain why patient needs home oxygen:

## 2015-05-29 DIAGNOSIS — E781 Pure hyperglyceridemia: Secondary | ICD-10-CM | POA: Diagnosis not present

## 2015-05-29 DIAGNOSIS — I10 Essential (primary) hypertension: Secondary | ICD-10-CM | POA: Diagnosis not present

## 2015-05-29 DIAGNOSIS — E782 Mixed hyperlipidemia: Secondary | ICD-10-CM | POA: Diagnosis not present

## 2015-05-29 DIAGNOSIS — E780 Pure hypercholesterolemia: Secondary | ICD-10-CM | POA: Diagnosis not present

## 2015-05-29 DIAGNOSIS — K210 Gastro-esophageal reflux disease with esophagitis: Secondary | ICD-10-CM | POA: Diagnosis not present

## 2015-06-05 DIAGNOSIS — K210 Gastro-esophageal reflux disease with esophagitis: Secondary | ICD-10-CM | POA: Diagnosis not present

## 2015-06-05 DIAGNOSIS — F419 Anxiety disorder, unspecified: Secondary | ICD-10-CM | POA: Diagnosis not present

## 2015-06-05 DIAGNOSIS — M818 Other osteoporosis without current pathological fracture: Secondary | ICD-10-CM | POA: Diagnosis not present

## 2015-06-05 DIAGNOSIS — M5430 Sciatica, unspecified side: Secondary | ICD-10-CM | POA: Diagnosis not present

## 2015-06-05 DIAGNOSIS — I10 Essential (primary) hypertension: Secondary | ICD-10-CM | POA: Diagnosis not present

## 2015-06-05 DIAGNOSIS — R1310 Dysphagia, unspecified: Secondary | ICD-10-CM | POA: Diagnosis not present

## 2015-06-05 DIAGNOSIS — R739 Hyperglycemia, unspecified: Secondary | ICD-10-CM | POA: Diagnosis not present

## 2015-06-05 DIAGNOSIS — E781 Pure hyperglyceridemia: Secondary | ICD-10-CM | POA: Diagnosis not present

## 2015-06-05 DIAGNOSIS — E780 Pure hypercholesterolemia: Secondary | ICD-10-CM | POA: Diagnosis not present

## 2015-06-05 DIAGNOSIS — M545 Low back pain: Secondary | ICD-10-CM | POA: Diagnosis not present

## 2015-06-11 DIAGNOSIS — J449 Chronic obstructive pulmonary disease, unspecified: Secondary | ICD-10-CM | POA: Diagnosis not present

## 2015-06-11 DIAGNOSIS — I10 Essential (primary) hypertension: Secondary | ICD-10-CM | POA: Diagnosis not present

## 2015-07-16 DIAGNOSIS — M4302 Spondylolysis, cervical region: Secondary | ICD-10-CM | POA: Diagnosis not present

## 2015-07-16 DIAGNOSIS — M47816 Spondylosis without myelopathy or radiculopathy, lumbar region: Secondary | ICD-10-CM | POA: Diagnosis not present

## 2015-07-16 DIAGNOSIS — M419 Scoliosis, unspecified: Secondary | ICD-10-CM | POA: Diagnosis not present

## 2015-07-16 DIAGNOSIS — M810 Age-related osteoporosis without current pathological fracture: Secondary | ICD-10-CM | POA: Diagnosis not present

## 2015-09-09 ENCOUNTER — Other Ambulatory Visit: Payer: Self-pay | Admitting: Cardiovascular Disease

## 2015-09-09 NOTE — Telephone Encounter (Signed)
REFILL 

## 2015-10-08 DIAGNOSIS — I10 Essential (primary) hypertension: Secondary | ICD-10-CM | POA: Diagnosis not present

## 2015-10-08 DIAGNOSIS — J441 Chronic obstructive pulmonary disease with (acute) exacerbation: Secondary | ICD-10-CM | POA: Diagnosis not present

## 2015-10-08 DIAGNOSIS — Z23 Encounter for immunization: Secondary | ICD-10-CM | POA: Diagnosis not present

## 2015-10-17 DIAGNOSIS — M47816 Spondylosis without myelopathy or radiculopathy, lumbar region: Secondary | ICD-10-CM | POA: Diagnosis not present

## 2015-12-03 DIAGNOSIS — K210 Gastro-esophageal reflux disease with esophagitis: Secondary | ICD-10-CM | POA: Diagnosis not present

## 2015-12-03 DIAGNOSIS — E782 Mixed hyperlipidemia: Secondary | ICD-10-CM | POA: Diagnosis not present

## 2015-12-03 DIAGNOSIS — I10 Essential (primary) hypertension: Secondary | ICD-10-CM | POA: Diagnosis not present

## 2015-12-03 DIAGNOSIS — E7800 Pure hypercholesterolemia, unspecified: Secondary | ICD-10-CM | POA: Diagnosis not present

## 2015-12-03 DIAGNOSIS — E781 Pure hyperglyceridemia: Secondary | ICD-10-CM | POA: Diagnosis not present

## 2015-12-03 DIAGNOSIS — Z13220 Encounter for screening for lipoid disorders: Secondary | ICD-10-CM | POA: Diagnosis not present

## 2015-12-16 DIAGNOSIS — Z1389 Encounter for screening for other disorder: Secondary | ICD-10-CM | POA: Diagnosis not present

## 2015-12-16 DIAGNOSIS — I10 Essential (primary) hypertension: Secondary | ICD-10-CM | POA: Diagnosis not present

## 2015-12-16 DIAGNOSIS — M5431 Sciatica, right side: Secondary | ICD-10-CM | POA: Diagnosis not present

## 2015-12-16 DIAGNOSIS — K210 Gastro-esophageal reflux disease with esophagitis: Secondary | ICD-10-CM | POA: Diagnosis not present

## 2015-12-16 DIAGNOSIS — M545 Low back pain: Secondary | ICD-10-CM | POA: Diagnosis not present

## 2015-12-16 DIAGNOSIS — M818 Other osteoporosis without current pathological fracture: Secondary | ICD-10-CM | POA: Diagnosis not present

## 2015-12-16 DIAGNOSIS — E781 Pure hyperglyceridemia: Secondary | ICD-10-CM | POA: Diagnosis not present

## 2015-12-16 DIAGNOSIS — F419 Anxiety disorder, unspecified: Secondary | ICD-10-CM | POA: Diagnosis not present

## 2015-12-16 DIAGNOSIS — Z23 Encounter for immunization: Secondary | ICD-10-CM | POA: Diagnosis not present

## 2015-12-16 DIAGNOSIS — R1310 Dysphagia, unspecified: Secondary | ICD-10-CM | POA: Diagnosis not present

## 2015-12-16 DIAGNOSIS — R739 Hyperglycemia, unspecified: Secondary | ICD-10-CM | POA: Diagnosis not present

## 2016-01-15 ENCOUNTER — Other Ambulatory Visit: Payer: Self-pay | Admitting: Cardiology

## 2016-01-15 DIAGNOSIS — I6523 Occlusion and stenosis of bilateral carotid arteries: Secondary | ICD-10-CM

## 2016-01-22 ENCOUNTER — Ambulatory Visit: Payer: Medicare Other

## 2016-01-22 DIAGNOSIS — I6523 Occlusion and stenosis of bilateral carotid arteries: Secondary | ICD-10-CM

## 2016-01-23 DIAGNOSIS — M810 Age-related osteoporosis without current pathological fracture: Secondary | ICD-10-CM | POA: Diagnosis not present

## 2016-01-23 DIAGNOSIS — M47816 Spondylosis without myelopathy or radiculopathy, lumbar region: Secondary | ICD-10-CM | POA: Diagnosis not present

## 2016-01-23 DIAGNOSIS — M419 Scoliosis, unspecified: Secondary | ICD-10-CM | POA: Diagnosis not present

## 2016-02-04 DIAGNOSIS — J449 Chronic obstructive pulmonary disease, unspecified: Secondary | ICD-10-CM | POA: Diagnosis not present

## 2016-02-04 DIAGNOSIS — I10 Essential (primary) hypertension: Secondary | ICD-10-CM | POA: Diagnosis not present

## 2016-04-21 DIAGNOSIS — M47816 Spondylosis without myelopathy or radiculopathy, lumbar region: Secondary | ICD-10-CM | POA: Diagnosis not present

## 2016-06-03 DIAGNOSIS — E782 Mixed hyperlipidemia: Secondary | ICD-10-CM | POA: Diagnosis not present

## 2016-06-03 DIAGNOSIS — I10 Essential (primary) hypertension: Secondary | ICD-10-CM | POA: Diagnosis not present

## 2016-06-03 DIAGNOSIS — D519 Vitamin B12 deficiency anemia, unspecified: Secondary | ICD-10-CM | POA: Diagnosis not present

## 2016-06-03 DIAGNOSIS — G3184 Mild cognitive impairment, so stated: Secondary | ICD-10-CM | POA: Diagnosis not present

## 2016-06-04 DIAGNOSIS — I10 Essential (primary) hypertension: Secondary | ICD-10-CM | POA: Diagnosis not present

## 2016-06-04 DIAGNOSIS — F419 Anxiety disorder, unspecified: Secondary | ICD-10-CM | POA: Diagnosis not present

## 2016-06-04 DIAGNOSIS — K210 Gastro-esophageal reflux disease with esophagitis: Secondary | ICD-10-CM | POA: Diagnosis not present

## 2016-06-04 DIAGNOSIS — R739 Hyperglycemia, unspecified: Secondary | ICD-10-CM | POA: Diagnosis not present

## 2016-06-04 DIAGNOSIS — E7800 Pure hypercholesterolemia, unspecified: Secondary | ICD-10-CM | POA: Diagnosis not present

## 2016-06-04 DIAGNOSIS — E781 Pure hyperglyceridemia: Secondary | ICD-10-CM | POA: Diagnosis not present

## 2016-06-04 DIAGNOSIS — Z6830 Body mass index (BMI) 30.0-30.9, adult: Secondary | ICD-10-CM | POA: Diagnosis not present

## 2016-06-04 DIAGNOSIS — R1310 Dysphagia, unspecified: Secondary | ICD-10-CM | POA: Diagnosis not present

## 2016-06-27 IMAGING — DX DG CHEST 2V
2 series · 2 of 2 positions shown · non-contrast
Comparison: None.

CLINICAL DATA: Shortness of breath for 2 months.

EXAM:
CHEST  2 VIEW

[chest pa]
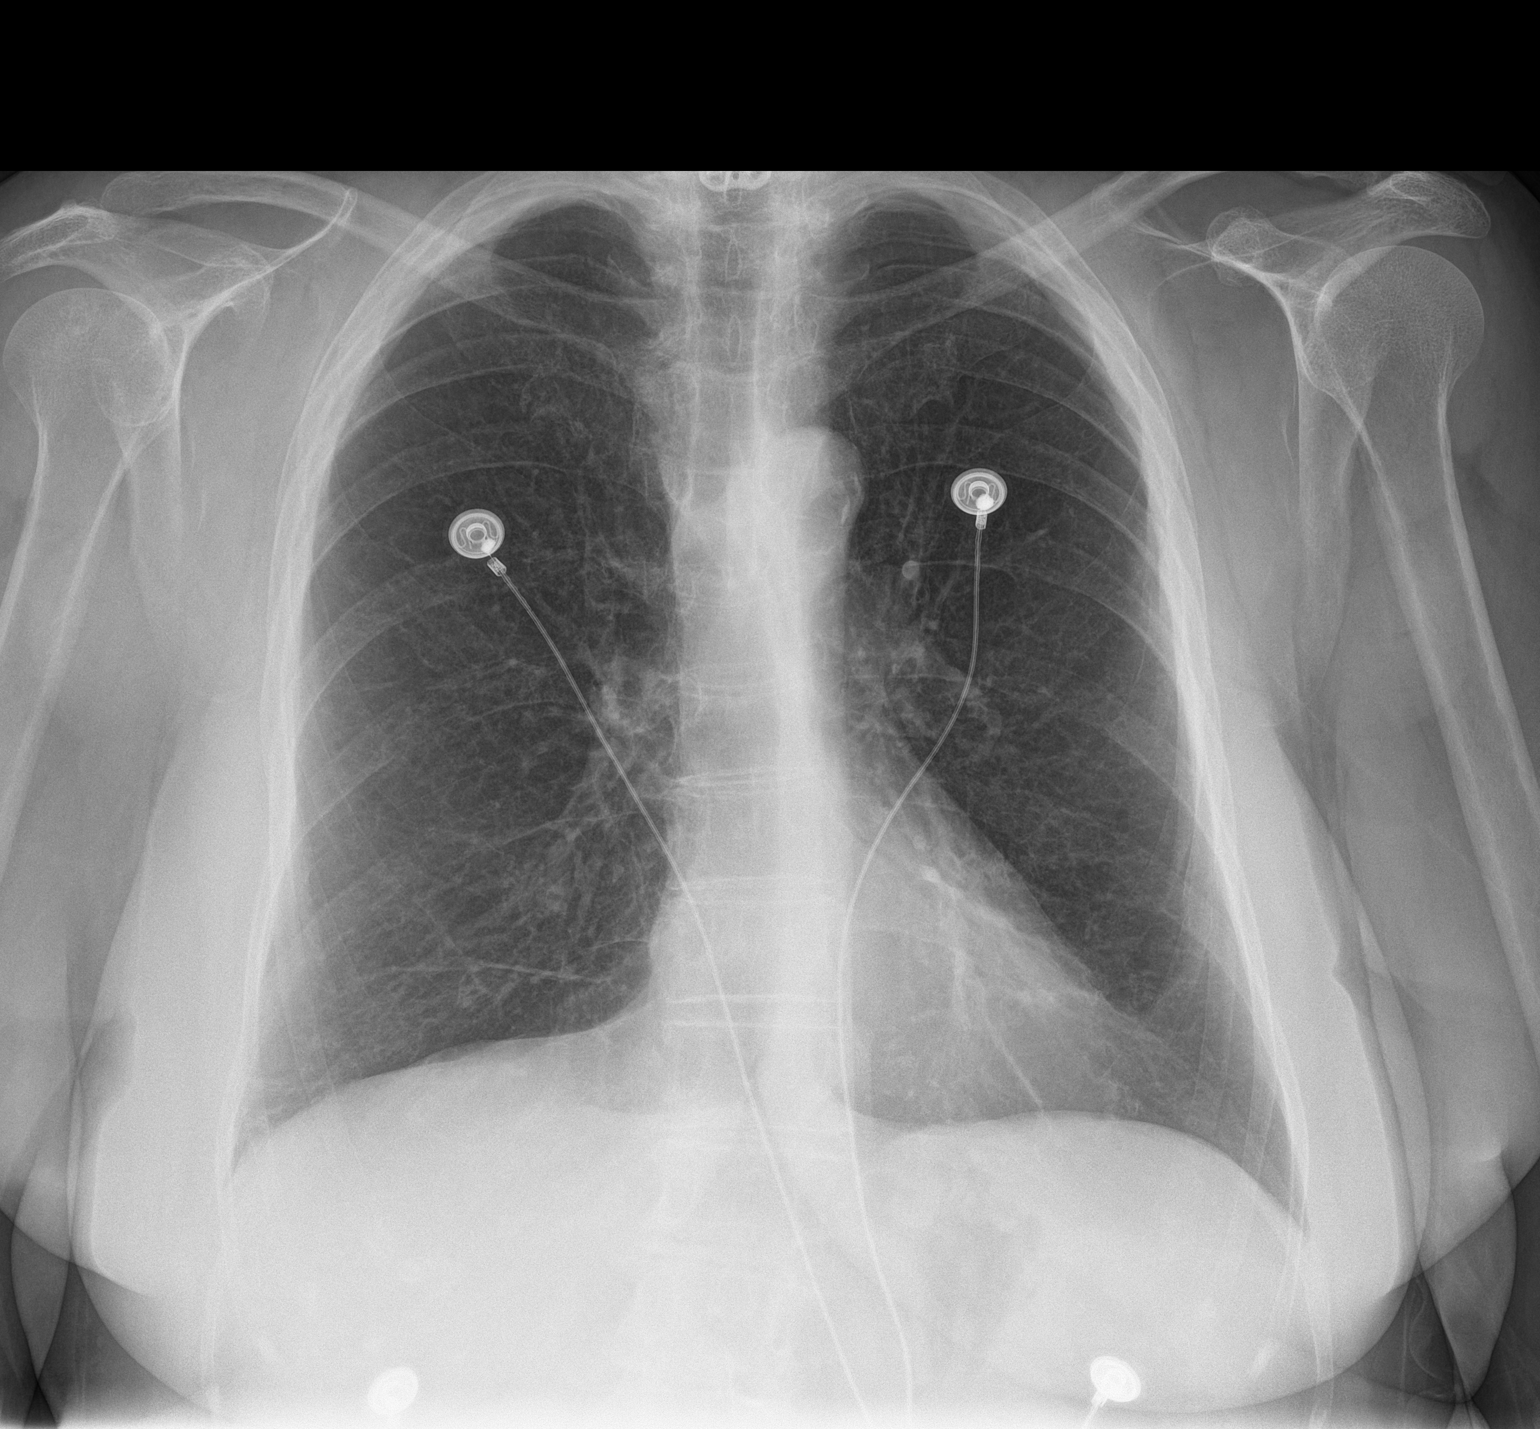

[chest lat]
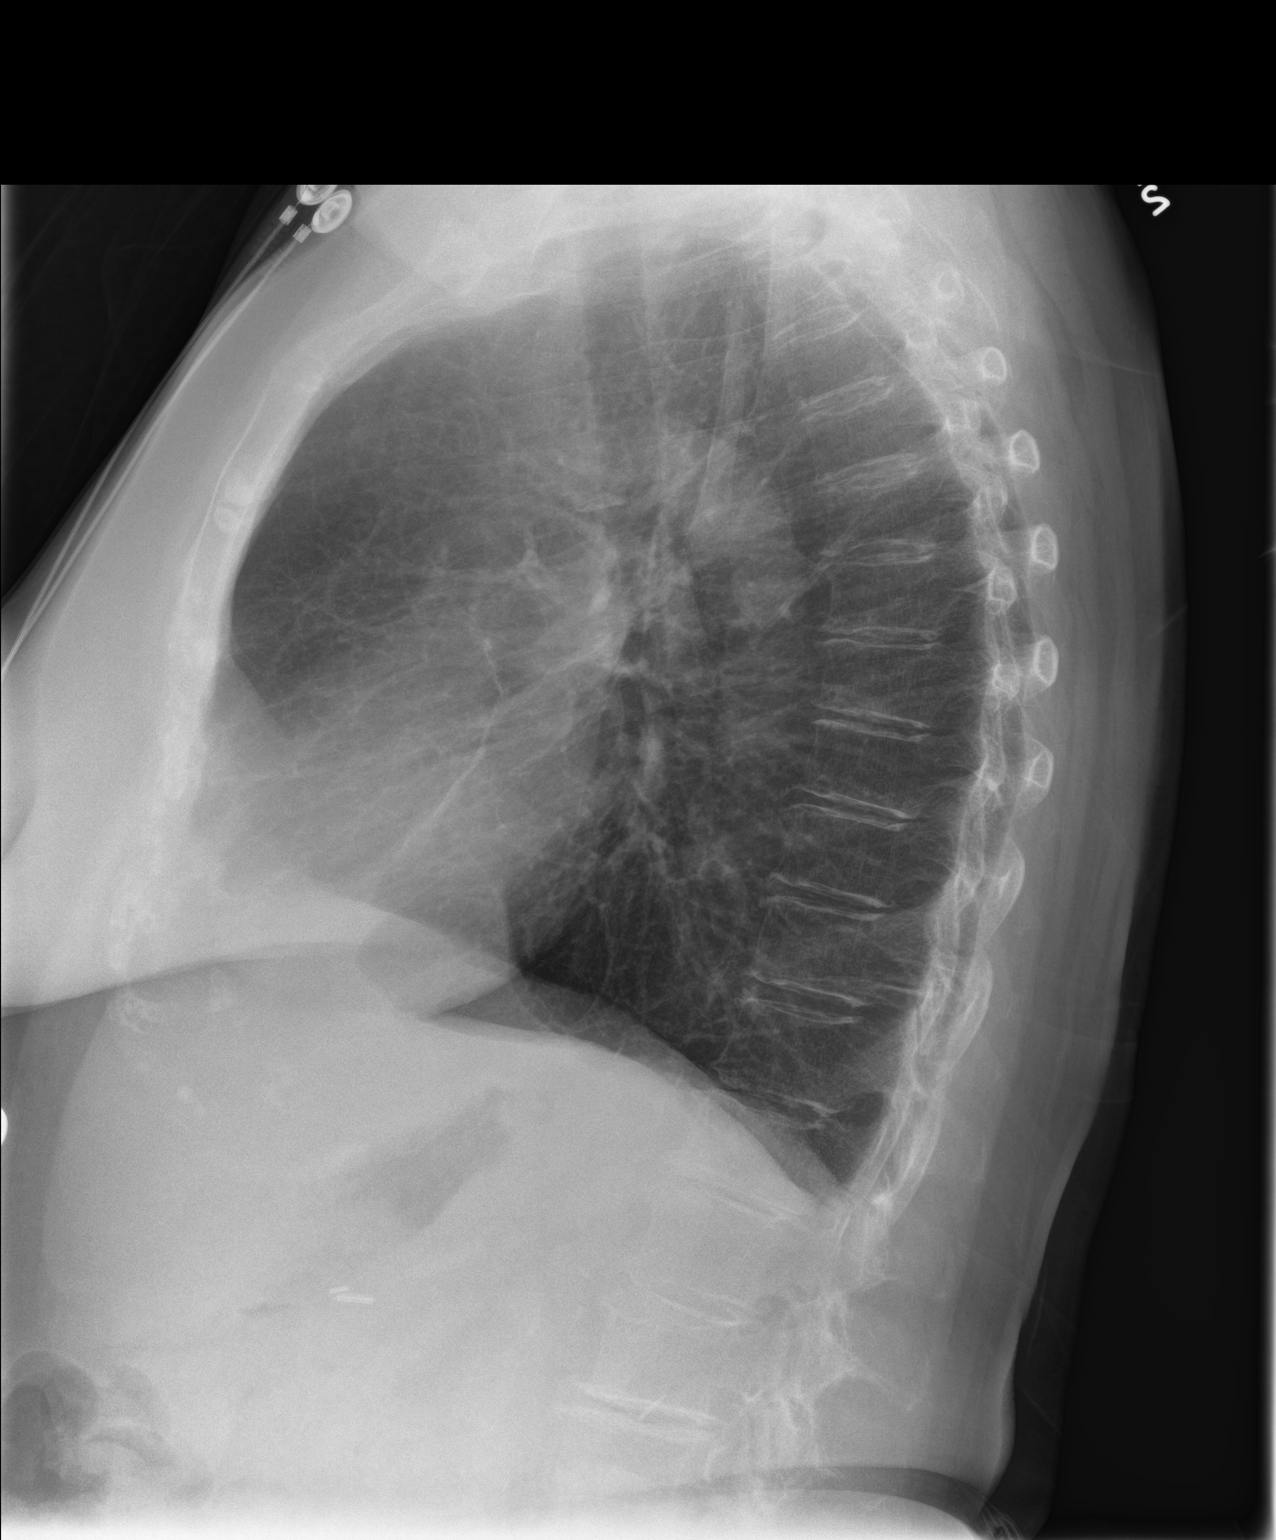

[2 of 2 positions shown; findings below may reference images not displayed]

FINDINGS: Mediastinum and hilar structures are normal. Bibasilar mild
pleural-parenchymal scarring. No pleural effusion or pneumothorax.
Heart size normal. Prior cervical spine fusion.
IMPRESSION: 1. No acute cardiopulmonary disease.
2. Mild bibasilar pleural-parenchymal scarring.

## 2016-07-24 ENCOUNTER — Other Ambulatory Visit: Payer: Self-pay

## 2016-07-27 DIAGNOSIS — E781 Pure hyperglyceridemia: Secondary | ICD-10-CM | POA: Diagnosis not present

## 2016-07-27 DIAGNOSIS — E7800 Pure hypercholesterolemia, unspecified: Secondary | ICD-10-CM | POA: Diagnosis not present

## 2016-07-27 DIAGNOSIS — I10 Essential (primary) hypertension: Secondary | ICD-10-CM | POA: Diagnosis not present

## 2016-07-27 DIAGNOSIS — E782 Mixed hyperlipidemia: Secondary | ICD-10-CM | POA: Diagnosis not present

## 2016-07-27 DIAGNOSIS — G3184 Mild cognitive impairment, so stated: Secondary | ICD-10-CM | POA: Diagnosis not present

## 2016-07-27 DIAGNOSIS — D519 Vitamin B12 deficiency anemia, unspecified: Secondary | ICD-10-CM | POA: Diagnosis not present

## 2016-07-27 DIAGNOSIS — K210 Gastro-esophageal reflux disease with esophagitis: Secondary | ICD-10-CM | POA: Diagnosis not present

## 2016-07-31 DIAGNOSIS — Z23 Encounter for immunization: Secondary | ICD-10-CM | POA: Diagnosis not present

## 2016-08-04 DIAGNOSIS — M47816 Spondylosis without myelopathy or radiculopathy, lumbar region: Secondary | ICD-10-CM | POA: Diagnosis not present

## 2016-08-04 DIAGNOSIS — M4302 Spondylolysis, cervical region: Secondary | ICD-10-CM | POA: Diagnosis not present

## 2016-08-04 DIAGNOSIS — Z981 Arthrodesis status: Secondary | ICD-10-CM | POA: Diagnosis not present

## 2016-08-04 DIAGNOSIS — M47812 Spondylosis without myelopathy or radiculopathy, cervical region: Secondary | ICD-10-CM | POA: Diagnosis not present

## 2016-08-06 DIAGNOSIS — J449 Chronic obstructive pulmonary disease, unspecified: Secondary | ICD-10-CM | POA: Diagnosis not present

## 2016-08-06 DIAGNOSIS — I10 Essential (primary) hypertension: Secondary | ICD-10-CM | POA: Diagnosis not present

## 2016-08-06 DIAGNOSIS — H4440 Unspecified hypotony of eye: Secondary | ICD-10-CM | POA: Diagnosis not present

## 2016-08-06 DIAGNOSIS — M1990 Unspecified osteoarthritis, unspecified site: Secondary | ICD-10-CM | POA: Diagnosis not present

## 2016-08-12 DIAGNOSIS — I781 Nevus, non-neoplastic: Secondary | ICD-10-CM | POA: Diagnosis not present

## 2016-08-12 DIAGNOSIS — L723 Sebaceous cyst: Secondary | ICD-10-CM | POA: Diagnosis not present

## 2016-08-31 ENCOUNTER — Other Ambulatory Visit: Payer: Self-pay | Admitting: Cardiovascular Disease

## 2016-09-21 DIAGNOSIS — M47816 Spondylosis without myelopathy or radiculopathy, lumbar region: Secondary | ICD-10-CM | POA: Diagnosis not present

## 2016-09-21 DIAGNOSIS — M4302 Spondylolysis, cervical region: Secondary | ICD-10-CM | POA: Diagnosis not present

## 2016-10-07 DIAGNOSIS — I10 Essential (primary) hypertension: Secondary | ICD-10-CM | POA: Diagnosis not present

## 2016-10-07 DIAGNOSIS — E782 Mixed hyperlipidemia: Secondary | ICD-10-CM | POA: Diagnosis not present

## 2016-10-07 DIAGNOSIS — E781 Pure hyperglyceridemia: Secondary | ICD-10-CM | POA: Diagnosis not present

## 2016-10-07 DIAGNOSIS — E7800 Pure hypercholesterolemia, unspecified: Secondary | ICD-10-CM | POA: Diagnosis not present

## 2016-10-07 DIAGNOSIS — R739 Hyperglycemia, unspecified: Secondary | ICD-10-CM | POA: Diagnosis not present

## 2016-10-07 DIAGNOSIS — K210 Gastro-esophageal reflux disease with esophagitis: Secondary | ICD-10-CM | POA: Diagnosis not present

## 2016-10-15 DIAGNOSIS — I10 Essential (primary) hypertension: Secondary | ICD-10-CM | POA: Diagnosis not present

## 2016-10-15 DIAGNOSIS — M545 Low back pain: Secondary | ICD-10-CM | POA: Diagnosis not present

## 2016-10-15 DIAGNOSIS — K210 Gastro-esophageal reflux disease with esophagitis: Secondary | ICD-10-CM | POA: Diagnosis not present

## 2016-10-15 DIAGNOSIS — M818 Other osteoporosis without current pathological fracture: Secondary | ICD-10-CM | POA: Diagnosis not present

## 2016-10-15 DIAGNOSIS — F419 Anxiety disorder, unspecified: Secondary | ICD-10-CM | POA: Diagnosis not present

## 2016-10-15 DIAGNOSIS — E781 Pure hyperglyceridemia: Secondary | ICD-10-CM | POA: Diagnosis not present

## 2016-10-15 DIAGNOSIS — Z6830 Body mass index (BMI) 30.0-30.9, adult: Secondary | ICD-10-CM | POA: Diagnosis not present

## 2016-10-15 DIAGNOSIS — R1310 Dysphagia, unspecified: Secondary | ICD-10-CM | POA: Diagnosis not present

## 2016-11-20 DIAGNOSIS — J0101 Acute recurrent maxillary sinusitis: Secondary | ICD-10-CM | POA: Diagnosis not present

## 2016-11-20 DIAGNOSIS — Z6828 Body mass index (BMI) 28.0-28.9, adult: Secondary | ICD-10-CM | POA: Diagnosis not present

## 2016-11-20 DIAGNOSIS — J441 Chronic obstructive pulmonary disease with (acute) exacerbation: Secondary | ICD-10-CM | POA: Diagnosis not present

## 2016-12-29 DIAGNOSIS — M47816 Spondylosis without myelopathy or radiculopathy, lumbar region: Secondary | ICD-10-CM | POA: Diagnosis not present

## 2017-01-28 DIAGNOSIS — J449 Chronic obstructive pulmonary disease, unspecified: Secondary | ICD-10-CM | POA: Diagnosis not present

## 2017-01-28 DIAGNOSIS — K210 Gastro-esophageal reflux disease with esophagitis: Secondary | ICD-10-CM | POA: Diagnosis not present

## 2017-01-28 DIAGNOSIS — F419 Anxiety disorder, unspecified: Secondary | ICD-10-CM | POA: Diagnosis not present

## 2017-01-28 DIAGNOSIS — I10 Essential (primary) hypertension: Secondary | ICD-10-CM | POA: Diagnosis not present

## 2017-02-04 DIAGNOSIS — I10 Essential (primary) hypertension: Secondary | ICD-10-CM | POA: Diagnosis not present

## 2017-02-04 DIAGNOSIS — N183 Chronic kidney disease, stage 3 (moderate): Secondary | ICD-10-CM | POA: Diagnosis not present

## 2017-02-04 DIAGNOSIS — E782 Mixed hyperlipidemia: Secondary | ICD-10-CM | POA: Diagnosis not present

## 2017-02-04 DIAGNOSIS — J441 Chronic obstructive pulmonary disease with (acute) exacerbation: Secondary | ICD-10-CM | POA: Diagnosis not present

## 2017-02-04 DIAGNOSIS — E781 Pure hyperglyceridemia: Secondary | ICD-10-CM | POA: Diagnosis not present

## 2017-02-04 DIAGNOSIS — E7800 Pure hypercholesterolemia, unspecified: Secondary | ICD-10-CM | POA: Diagnosis not present

## 2017-02-16 DIAGNOSIS — R1310 Dysphagia, unspecified: Secondary | ICD-10-CM | POA: Diagnosis not present

## 2017-02-16 DIAGNOSIS — M5431 Sciatica, right side: Secondary | ICD-10-CM | POA: Diagnosis not present

## 2017-02-16 DIAGNOSIS — I10 Essential (primary) hypertension: Secondary | ICD-10-CM | POA: Diagnosis not present

## 2017-02-16 DIAGNOSIS — E781 Pure hyperglyceridemia: Secondary | ICD-10-CM | POA: Diagnosis not present

## 2017-02-16 DIAGNOSIS — K210 Gastro-esophageal reflux disease with esophagitis: Secondary | ICD-10-CM | POA: Diagnosis not present

## 2017-02-16 DIAGNOSIS — M545 Low back pain: Secondary | ICD-10-CM | POA: Diagnosis not present

## 2017-02-16 DIAGNOSIS — F419 Anxiety disorder, unspecified: Secondary | ICD-10-CM | POA: Diagnosis not present

## 2017-02-16 DIAGNOSIS — M818 Other osteoporosis without current pathological fracture: Secondary | ICD-10-CM | POA: Diagnosis not present

## 2017-03-18 DIAGNOSIS — M47816 Spondylosis without myelopathy or radiculopathy, lumbar region: Secondary | ICD-10-CM | POA: Diagnosis not present

## 2017-06-22 DIAGNOSIS — E782 Mixed hyperlipidemia: Secondary | ICD-10-CM | POA: Diagnosis not present

## 2017-06-22 DIAGNOSIS — K210 Gastro-esophageal reflux disease with esophagitis: Secondary | ICD-10-CM | POA: Diagnosis not present

## 2017-06-22 DIAGNOSIS — N183 Chronic kidney disease, stage 3 (moderate): Secondary | ICD-10-CM | POA: Diagnosis not present

## 2017-06-22 DIAGNOSIS — E7800 Pure hypercholesterolemia, unspecified: Secondary | ICD-10-CM | POA: Diagnosis not present

## 2017-06-22 DIAGNOSIS — E781 Pure hyperglyceridemia: Secondary | ICD-10-CM | POA: Diagnosis not present

## 2017-06-22 DIAGNOSIS — J441 Chronic obstructive pulmonary disease with (acute) exacerbation: Secondary | ICD-10-CM | POA: Diagnosis not present

## 2017-06-22 DIAGNOSIS — I10 Essential (primary) hypertension: Secondary | ICD-10-CM | POA: Diagnosis not present

## 2017-06-24 DIAGNOSIS — G3184 Mild cognitive impairment, so stated: Secondary | ICD-10-CM | POA: Diagnosis not present

## 2017-06-24 DIAGNOSIS — N183 Chronic kidney disease, stage 3 (moderate): Secondary | ICD-10-CM | POA: Diagnosis not present

## 2017-06-24 DIAGNOSIS — J438 Other emphysema: Secondary | ICD-10-CM | POA: Diagnosis not present

## 2017-06-24 DIAGNOSIS — I10 Essential (primary) hypertension: Secondary | ICD-10-CM | POA: Diagnosis not present

## 2017-06-24 DIAGNOSIS — E782 Mixed hyperlipidemia: Secondary | ICD-10-CM | POA: Diagnosis not present

## 2017-06-24 DIAGNOSIS — M810 Age-related osteoporosis without current pathological fracture: Secondary | ICD-10-CM | POA: Diagnosis not present

## 2017-06-24 DIAGNOSIS — Z0000 Encounter for general adult medical examination without abnormal findings: Secondary | ICD-10-CM | POA: Diagnosis not present

## 2017-06-24 DIAGNOSIS — Z6827 Body mass index (BMI) 27.0-27.9, adult: Secondary | ICD-10-CM | POA: Diagnosis not present

## 2017-07-09 DIAGNOSIS — M47816 Spondylosis without myelopathy or radiculopathy, lumbar region: Secondary | ICD-10-CM | POA: Diagnosis not present

## 2017-07-09 DIAGNOSIS — M419 Scoliosis, unspecified: Secondary | ICD-10-CM | POA: Diagnosis not present

## 2017-07-09 DIAGNOSIS — M810 Age-related osteoporosis without current pathological fracture: Secondary | ICD-10-CM | POA: Diagnosis not present

## 2017-09-06 ENCOUNTER — Other Ambulatory Visit: Payer: Self-pay | Admitting: Cardiovascular Disease

## 2017-09-08 ENCOUNTER — Other Ambulatory Visit: Payer: Self-pay | Admitting: Cardiovascular Disease

## 2017-10-14 DIAGNOSIS — M419 Scoliosis, unspecified: Secondary | ICD-10-CM | POA: Diagnosis not present

## 2017-10-14 DIAGNOSIS — M47816 Spondylosis without myelopathy or radiculopathy, lumbar region: Secondary | ICD-10-CM | POA: Diagnosis not present

## 2017-10-14 DIAGNOSIS — M810 Age-related osteoporosis without current pathological fracture: Secondary | ICD-10-CM | POA: Diagnosis not present

## 2017-10-25 DIAGNOSIS — J441 Chronic obstructive pulmonary disease with (acute) exacerbation: Secondary | ICD-10-CM | POA: Diagnosis not present

## 2017-10-25 DIAGNOSIS — E781 Pure hyperglyceridemia: Secondary | ICD-10-CM | POA: Diagnosis not present

## 2017-10-25 DIAGNOSIS — E782 Mixed hyperlipidemia: Secondary | ICD-10-CM | POA: Diagnosis not present

## 2017-10-25 DIAGNOSIS — I10 Essential (primary) hypertension: Secondary | ICD-10-CM | POA: Diagnosis not present

## 2017-10-25 DIAGNOSIS — E7800 Pure hypercholesterolemia, unspecified: Secondary | ICD-10-CM | POA: Diagnosis not present

## 2017-10-26 NOTE — Progress Notes (Signed)
Cardiology Office Note   Date:  10/28/2017   ID:  Dorothy GrahamJoyce Malcomb, DOB 04-Jul-1941, MRN [id number]  PCP:  Estanislado PandySasser, Paul W, MD  Cardiologist:   Rollene RotundaJames Raevyn Sokol, MD    Chief Complaint  Patient presents with  . Dizziness      History of Present Illness: Dorothy Bryant is a 76 y.o. female who presents for evaluation of LVH, carotid stenosis and HTN.   She did have chest pain in 2012 and had a cardiac catheterization with normal coronaries. She's had a normal echocardiogram as well.  I last saw her in 2016.  Since I last saw her she has done well.  The patient denies any new symptoms such as chest discomfort, neck or arm discomfort. There has been no new shortness of breath, PND or orthopnea. There have been no reported palpitations, presyncope or syncope.  She lives by herself and does chores like vacuuming.   She has some rare tingling sensation in her chest but it is not pain.  She has chronic COPD and takes meds including MDIs for this and her breathing is baseline.   Past Medical History:  Diagnosis Date  . Chest pain    Cardiac catheterization 7/12: Normal coronary arteries, EF 60-65%, question mid LVOT obstruction with a 10 mm mercury gradient;   echo 7/12: Mild LVH, EF 55-60%, grade 2 diastolic dysfunction, mild LAE, PAS P. 29  . COPD (chronic obstructive pulmonary disease) (HCC)   . GERD (gastroesophageal reflux disease)   . Heart palpitations    Event monitor 8/12  . Hypertension   . LBBB (left bundle branch block)     Past Surgical History:  Procedure Laterality Date  . ABDOMINAL HYSTERECTOMY    . CARDIAC CATHETERIZATION  06/30/2011   Est. EF of 60-65% with normal coronary arteries --   . CATARACT EXTRACTION    . CERVICAL SPINE SURGERY    . CHOLECYSTECTOMY       Current Outpatient Medications  Medication Sig Dispense Refill  . albuterol (PROVENTIL) (2.5 MG/3ML) 0.083% nebulizer solution Take 2.5 mg by nebulization 3 (three) times daily.     Marland Kitchen. aspirin 81 MG tablet Take 81  mg by mouth daily.      . budesonide-formoterol (SYMBICORT) 160-4.5 MCG/ACT inhaler Inhale 2 puffs into the lungs 2 (two) times daily.      . citalopram (CELEXA) 10 MG tablet Take 10 mg by mouth daily.      Marland Kitchen. HYDROcodone-acetaminophen (NORCO/VICODIN) 5-325 MG per tablet Take 1 tablet by mouth every 6 (six) hours as needed for moderate pain.    Marland Kitchen. levothyroxine (SYNTHROID, LEVOTHROID) 25 MCG tablet Take 25 mcg by mouth daily before breakfast.    . losartan-hydrochlorothiazide (HYZAAR) 100-12.5 MG tablet Take 1 tablet by mouth daily. 30 tablet 6  . meloxicam (MOBIC) 7.5 MG tablet Take 7.5 mg by mouth daily.    . metoprolol succinate (TOPROL-XL) 50 MG 24 hr tablet TAKE ONE TABLET BY MOUTH ONCE DAILY WITH OR IMMEDIATELY  FOLLOWING  A  MEAL 30 tablet 11  . raloxifene (EVISTA) 60 MG tablet Take 60 mg by mouth daily.    . simvastatin (ZOCOR) 10 MG tablet Take 10 mg by mouth every evening.     . vitamin B-12 (CYANOCOBALAMIN) 500 MCG tablet Take 500 mcg by mouth daily.     No current facility-administered medications for this visit.     Allergies:   Tagamet [cimetidine]     ROS:  Please see the history of present illness.  Otherwise, review of systems are positive for joint pain.   All other systems are reviewed and negative.    PHYSICAL EXAM: VS:  BP (!) 158/90   Pulse 61   Ht 5' (1.524 m)   Wt 142 lb (64.4 kg)   SpO2 97%   BMI 27.73 kg/m  , BMI Body mass index is 27.73 kg/m. GENERAL:  Well appearing NECK:  No jugular venous distention, waveform within normal limits, carotid upstroke brisk and symmetric, no bruits, no thyromegaly LYMPHATICS:  No cervical, inguinal adenopathy LUNGS:  Clear to auscultation bilaterally CHEST:  Unremarkable HEART:  PMI not displaced or sustained,S1 and S2 within normal limits, no S3, no S4, no clicks, no rubs, no murmurs ABD:  Flat, positive bowel sounds normal in frequency in pitch, no bruits, no rebound, no guarding, no midline pulsatile mass, no  hepatomegaly, no splenomegaly EXT:  2 plus pulses throughout, no edema, no cyanosis no clubbing   EKG:  EKG is ordered today. The ekg ordered today demonstrates sinus rhythm, rate 74, borderline left bundle branch block, left axis deviation, no acute ST-T wave changes.   Recent Labs: No results found for requested labs within last 8760 hours.    Lipid Panel No results found for: CHOL, TRIG, HDL, CHOLHDL, VLDL, LDLCALC, LDLDIRECT    Wt Readings from Last 3 Encounters:  10/28/17 142 lb (64.4 kg)  05/02/15 155 lb 9.6 oz (70.6 kg)  02/13/15 159 lb (72.1 kg)      Other studies Reviewed: Additional studies/ records that were reviewed today include:   Labs. Review of the above records demonstrates:  Please see elsewhere in the note.     ASSESSMENT AND PLAN:    PALPITATIONS/DIZZINESS: She is no longer bothered by palpitations and the dizziness is mild and baseline.  No further cardiovascular evaluation is indicated.  HTN:  She needs to increase her losartan HCT to 1 pill daily.  She will get a basic metabolic profile in 2 weeks.  CAROTID STENOSIS:   She had 40 - 59% left stenosis in 2016 and is overdue for follow up.  I will arrange follow-up.  LVH:  She does not have any symptoms consistent with diastolic dysfunction.  This was mild in the past.  I do not think further imaging is indicated but she needs good control of her blood pressure   Current medicines are reviewed at length with the patient today.  The patient does not have concerns regarding medicines.  The following changes have been made:  As above  Labs/ tests ordered today include:   Orders Placed This Encounter  Procedures  . Basic metabolic panel  . EKG 12-Lead     Disposition:   FU with me as needed.      Signed, Rollene RotundaJames Camila Maita, MD  10/28/2017 9:51 AM    [facility]  h

## 2017-10-27 ENCOUNTER — Encounter: Payer: Self-pay | Admitting: *Deleted

## 2017-10-28 ENCOUNTER — Encounter: Payer: Self-pay | Admitting: Cardiology

## 2017-10-28 ENCOUNTER — Ambulatory Visit (INDEPENDENT_AMBULATORY_CARE_PROVIDER_SITE_OTHER): Payer: Medicare Other | Admitting: Cardiology

## 2017-10-28 VITALS — BP 158/90 | HR 61 | Ht 60.0 in | Wt 142.0 lb

## 2017-10-28 DIAGNOSIS — I6529 Occlusion and stenosis of unspecified carotid artery: Secondary | ICD-10-CM | POA: Diagnosis not present

## 2017-10-28 DIAGNOSIS — N183 Chronic kidney disease, stage 3 (moderate): Secondary | ICD-10-CM | POA: Diagnosis not present

## 2017-10-28 DIAGNOSIS — I10 Essential (primary) hypertension: Secondary | ICD-10-CM | POA: Diagnosis not present

## 2017-10-28 DIAGNOSIS — E782 Mixed hyperlipidemia: Secondary | ICD-10-CM | POA: Diagnosis not present

## 2017-10-28 DIAGNOSIS — J438 Other emphysema: Secondary | ICD-10-CM | POA: Diagnosis not present

## 2017-10-28 DIAGNOSIS — G3184 Mild cognitive impairment, so stated: Secondary | ICD-10-CM | POA: Diagnosis not present

## 2017-10-28 MED ORDER — LOSARTAN POTASSIUM-HCTZ 100-12.5 MG PO TABS: 1.0000 | ORAL_TABLET | Freq: Every day | ORAL | 6 refills | Status: DC

## 2017-10-28 NOTE — Patient Instructions (Addendum)
Medication Instructions:   Increase Losartan/HCT to whole pill daily.  Continue all other medications.    Labwork:  BMET - order given today.  Due in 2 weeks.  Office will contact with results via phone or letter.    Testing/Procedures:  Your physician has requested that you have a carotid duplex. This test is an ultrasound of the carotid arteries in your neck. It looks at blood flow through these arteries that supply the brain with blood. Allow one hour for this exam. There are no restrictions or special instructions.  Office will contact with results via phone or letter.    Follow-Up: As needed   Any Other Special Instructions Will Be Listed Below (If Applicable).  If you need a refill on your cardiac medications before your next appointment, please call your pharmacy.

## 2017-11-01 DIAGNOSIS — M810 Age-related osteoporosis without current pathological fracture: Secondary | ICD-10-CM | POA: Diagnosis not present

## 2017-11-01 DIAGNOSIS — Z780 Asymptomatic menopausal state: Secondary | ICD-10-CM | POA: Diagnosis not present

## 2017-11-01 DIAGNOSIS — M8588 Other specified disorders of bone density and structure, other site: Secondary | ICD-10-CM | POA: Diagnosis not present

## 2017-11-04 ENCOUNTER — Ambulatory Visit (INDEPENDENT_AMBULATORY_CARE_PROVIDER_SITE_OTHER): Payer: Medicare Other

## 2017-11-04 DIAGNOSIS — I6529 Occlusion and stenosis of unspecified carotid artery: Secondary | ICD-10-CM | POA: Diagnosis not present

## 2017-11-05 LAB — VAS US CAROTID
LCCADDIAS: 20 cm/s
LCCAPSYS: 149 cm/s
LEFT ECA DIAS: 0 cm/s
LEFT VERTEBRAL DIAS: 11 cm/s
LICADDIAS: -20 cm/s
LICADSYS: -111 cm/s
LICAPDIAS: 41 cm/s
LICAPSYS: 174 cm/s
Left CCA dist sys: 96 cm/s
Left CCA prox dias: 23 cm/s
RIGHT ECA DIAS: 0 cm/s
RIGHT VERTEBRAL DIAS: 12 cm/s
Right CCA prox dias: 18 cm/s
Right CCA prox sys: 131 cm/s
Right cca dist sys: -126 cm/s

## 2017-11-15 ENCOUNTER — Telehealth: Payer: Self-pay | Admitting: *Deleted

## 2017-11-15 DIAGNOSIS — I6523 Occlusion and stenosis of bilateral carotid arteries: Secondary | ICD-10-CM

## 2017-11-15 NOTE — Telephone Encounter (Signed)
Pt aware of her carotid doppler Carotid order for 1 Year  Result send to pt PCP via epic

## 2017-11-15 NOTE — Telephone Encounter (Signed)
-----   Message from Rollene RotundaJames Hochrein, MD sent at 11/11/2017 10:40 PM EST ----- Right ICA of a 1-39% stenosis. Left ICA of a 40-59% stenosis. This in an Follow up in one year.   Call Ms. Miyazaki with the results and send results to Estanislado PandySasser, Paul W, MD

## 2017-11-18 DIAGNOSIS — J0101 Acute recurrent maxillary sinusitis: Secondary | ICD-10-CM | POA: Diagnosis not present

## 2017-11-18 DIAGNOSIS — J441 Chronic obstructive pulmonary disease with (acute) exacerbation: Secondary | ICD-10-CM | POA: Diagnosis not present

## 2017-11-27 DIAGNOSIS — R509 Fever, unspecified: Secondary | ICD-10-CM | POA: Diagnosis not present

## 2017-11-27 DIAGNOSIS — J441 Chronic obstructive pulmonary disease with (acute) exacerbation: Secondary | ICD-10-CM | POA: Diagnosis not present

## 2017-12-28 DIAGNOSIS — H8301 Labyrinthitis, right ear: Secondary | ICD-10-CM | POA: Diagnosis not present

## 2018-01-25 DIAGNOSIS — M419 Scoliosis, unspecified: Secondary | ICD-10-CM | POA: Diagnosis not present

## 2018-01-25 DIAGNOSIS — M47816 Spondylosis without myelopathy or radiculopathy, lumbar region: Secondary | ICD-10-CM | POA: Diagnosis not present

## 2018-01-25 DIAGNOSIS — M810 Age-related osteoporosis without current pathological fracture: Secondary | ICD-10-CM | POA: Diagnosis not present

## 2018-02-25 DIAGNOSIS — I10 Essential (primary) hypertension: Secondary | ICD-10-CM | POA: Diagnosis not present

## 2018-02-25 DIAGNOSIS — R739 Hyperglycemia, unspecified: Secondary | ICD-10-CM | POA: Diagnosis not present

## 2018-02-25 DIAGNOSIS — E782 Mixed hyperlipidemia: Secondary | ICD-10-CM | POA: Diagnosis not present

## 2018-02-25 DIAGNOSIS — N183 Chronic kidney disease, stage 3 (moderate): Secondary | ICD-10-CM | POA: Diagnosis not present

## 2018-02-25 DIAGNOSIS — R413 Other amnesia: Secondary | ICD-10-CM | POA: Diagnosis not present

## 2018-02-25 DIAGNOSIS — J441 Chronic obstructive pulmonary disease with (acute) exacerbation: Secondary | ICD-10-CM | POA: Diagnosis not present

## 2018-02-25 DIAGNOSIS — R5383 Other fatigue: Secondary | ICD-10-CM | POA: Diagnosis not present

## 2018-03-01 DIAGNOSIS — J438 Other emphysema: Secondary | ICD-10-CM | POA: Diagnosis not present

## 2018-03-01 DIAGNOSIS — N183 Chronic kidney disease, stage 3 (moderate): Secondary | ICD-10-CM | POA: Diagnosis not present

## 2018-03-01 DIAGNOSIS — E782 Mixed hyperlipidemia: Secondary | ICD-10-CM | POA: Diagnosis not present

## 2018-03-01 DIAGNOSIS — I10 Essential (primary) hypertension: Secondary | ICD-10-CM | POA: Diagnosis not present

## 2018-03-01 DIAGNOSIS — G3184 Mild cognitive impairment, so stated: Secondary | ICD-10-CM | POA: Diagnosis not present

## 2018-04-26 DIAGNOSIS — M419 Scoliosis, unspecified: Secondary | ICD-10-CM | POA: Diagnosis not present

## 2018-04-26 DIAGNOSIS — M47816 Spondylosis without myelopathy or radiculopathy, lumbar region: Secondary | ICD-10-CM | POA: Diagnosis not present

## 2018-06-03 DIAGNOSIS — R42 Dizziness and giddiness: Secondary | ICD-10-CM | POA: Diagnosis not present

## 2018-06-03 DIAGNOSIS — R6889 Other general symptoms and signs: Secondary | ICD-10-CM | POA: Diagnosis not present

## 2018-06-03 DIAGNOSIS — R946 Abnormal results of thyroid function studies: Secondary | ICD-10-CM | POA: Diagnosis not present

## 2018-06-03 DIAGNOSIS — R7989 Other specified abnormal findings of blood chemistry: Secondary | ICD-10-CM | POA: Diagnosis not present

## 2018-06-03 DIAGNOSIS — E860 Dehydration: Secondary | ICD-10-CM | POA: Diagnosis not present

## 2018-06-03 DIAGNOSIS — H81312 Aural vertigo, left ear: Secondary | ICD-10-CM | POA: Diagnosis not present

## 2018-06-28 DIAGNOSIS — K210 Gastro-esophageal reflux disease with esophagitis: Secondary | ICD-10-CM | POA: Diagnosis not present

## 2018-06-28 DIAGNOSIS — R739 Hyperglycemia, unspecified: Secondary | ICD-10-CM | POA: Diagnosis not present

## 2018-06-28 DIAGNOSIS — E782 Mixed hyperlipidemia: Secondary | ICD-10-CM | POA: Diagnosis not present

## 2018-06-28 DIAGNOSIS — I10 Essential (primary) hypertension: Secondary | ICD-10-CM | POA: Diagnosis not present

## 2018-06-28 DIAGNOSIS — R42 Dizziness and giddiness: Secondary | ICD-10-CM | POA: Diagnosis not present

## 2018-06-28 DIAGNOSIS — Z0001 Encounter for general adult medical examination with abnormal findings: Secondary | ICD-10-CM | POA: Diagnosis not present

## 2018-06-28 DIAGNOSIS — N183 Chronic kidney disease, stage 3 (moderate): Secondary | ICD-10-CM | POA: Diagnosis not present

## 2018-07-05 DIAGNOSIS — J438 Other emphysema: Secondary | ICD-10-CM | POA: Diagnosis not present

## 2018-07-05 DIAGNOSIS — I10 Essential (primary) hypertension: Secondary | ICD-10-CM | POA: Diagnosis not present

## 2018-07-05 DIAGNOSIS — N183 Chronic kidney disease, stage 3 (moderate): Secondary | ICD-10-CM | POA: Diagnosis not present

## 2018-07-05 DIAGNOSIS — G3184 Mild cognitive impairment, so stated: Secondary | ICD-10-CM | POA: Diagnosis not present

## 2018-07-05 DIAGNOSIS — E782 Mixed hyperlipidemia: Secondary | ICD-10-CM | POA: Diagnosis not present

## 2018-07-25 DIAGNOSIS — M47816 Spondylosis without myelopathy or radiculopathy, lumbar region: Secondary | ICD-10-CM | POA: Diagnosis not present

## 2018-08-13 DIAGNOSIS — M546 Pain in thoracic spine: Secondary | ICD-10-CM | POA: Diagnosis not present

## 2018-08-13 DIAGNOSIS — Z79899 Other long term (current) drug therapy: Secondary | ICD-10-CM | POA: Diagnosis not present

## 2018-08-13 DIAGNOSIS — M1991 Primary osteoarthritis, unspecified site: Secondary | ICD-10-CM | POA: Diagnosis not present

## 2018-08-13 DIAGNOSIS — M545 Low back pain: Secondary | ICD-10-CM | POA: Diagnosis not present

## 2018-08-13 DIAGNOSIS — Z7982 Long term (current) use of aspirin: Secondary | ICD-10-CM | POA: Diagnosis not present

## 2018-08-13 DIAGNOSIS — J439 Emphysema, unspecified: Secondary | ICD-10-CM | POA: Diagnosis not present

## 2018-08-13 DIAGNOSIS — M1990 Unspecified osteoarthritis, unspecified site: Secondary | ICD-10-CM | POA: Diagnosis not present

## 2018-08-13 DIAGNOSIS — Z87891 Personal history of nicotine dependence: Secondary | ICD-10-CM | POA: Diagnosis not present

## 2018-08-13 DIAGNOSIS — N390 Urinary tract infection, site not specified: Secondary | ICD-10-CM | POA: Diagnosis not present

## 2018-08-13 DIAGNOSIS — R05 Cough: Secondary | ICD-10-CM | POA: Diagnosis not present

## 2018-08-13 DIAGNOSIS — R0781 Pleurodynia: Secondary | ICD-10-CM | POA: Diagnosis not present

## 2018-10-20 DIAGNOSIS — M810 Age-related osteoporosis without current pathological fracture: Secondary | ICD-10-CM | POA: Diagnosis not present

## 2018-10-20 DIAGNOSIS — M47816 Spondylosis without myelopathy or radiculopathy, lumbar region: Secondary | ICD-10-CM | POA: Diagnosis not present

## 2018-10-20 DIAGNOSIS — M419 Scoliosis, unspecified: Secondary | ICD-10-CM | POA: Diagnosis not present

## 2018-10-26 DIAGNOSIS — K210 Gastro-esophageal reflux disease with esophagitis: Secondary | ICD-10-CM | POA: Diagnosis not present

## 2018-10-26 DIAGNOSIS — I10 Essential (primary) hypertension: Secondary | ICD-10-CM | POA: Diagnosis not present

## 2018-10-26 DIAGNOSIS — E782 Mixed hyperlipidemia: Secondary | ICD-10-CM | POA: Diagnosis not present

## 2018-10-26 DIAGNOSIS — R739 Hyperglycemia, unspecified: Secondary | ICD-10-CM | POA: Diagnosis not present

## 2018-10-26 DIAGNOSIS — N183 Chronic kidney disease, stage 3 (moderate): Secondary | ICD-10-CM | POA: Diagnosis not present

## 2018-11-02 DIAGNOSIS — G3184 Mild cognitive impairment, so stated: Secondary | ICD-10-CM | POA: Diagnosis not present

## 2018-11-02 DIAGNOSIS — J438 Other emphysema: Secondary | ICD-10-CM | POA: Diagnosis not present

## 2018-11-02 DIAGNOSIS — E782 Mixed hyperlipidemia: Secondary | ICD-10-CM | POA: Diagnosis not present

## 2018-11-02 DIAGNOSIS — I10 Essential (primary) hypertension: Secondary | ICD-10-CM | POA: Diagnosis not present

## 2018-11-02 DIAGNOSIS — N183 Chronic kidney disease, stage 3 (moderate): Secondary | ICD-10-CM | POA: Diagnosis not present

## 2019-01-10 DIAGNOSIS — Z981 Arthrodesis status: Secondary | ICD-10-CM | POA: Diagnosis not present

## 2019-01-10 DIAGNOSIS — M47812 Spondylosis without myelopathy or radiculopathy, cervical region: Secondary | ICD-10-CM | POA: Diagnosis not present

## 2019-01-10 DIAGNOSIS — M50322 Other cervical disc degeneration at C5-C6 level: Secondary | ICD-10-CM | POA: Diagnosis not present

## 2019-01-10 DIAGNOSIS — M47816 Spondylosis without myelopathy or radiculopathy, lumbar region: Secondary | ICD-10-CM | POA: Diagnosis not present

## 2019-01-10 DIAGNOSIS — M4804 Spinal stenosis, thoracic region: Secondary | ICD-10-CM | POA: Diagnosis not present

## 2019-01-10 DIAGNOSIS — M419 Scoliosis, unspecified: Secondary | ICD-10-CM | POA: Diagnosis not present

## 2019-05-01 DIAGNOSIS — E782 Mixed hyperlipidemia: Secondary | ICD-10-CM | POA: Diagnosis not present

## 2019-05-01 DIAGNOSIS — E781 Pure hyperglyceridemia: Secondary | ICD-10-CM | POA: Diagnosis not present

## 2019-05-01 DIAGNOSIS — R739 Hyperglycemia, unspecified: Secondary | ICD-10-CM | POA: Diagnosis not present

## 2019-05-01 DIAGNOSIS — E7800 Pure hypercholesterolemia, unspecified: Secondary | ICD-10-CM | POA: Diagnosis not present

## 2019-05-01 DIAGNOSIS — K210 Gastro-esophageal reflux disease with esophagitis: Secondary | ICD-10-CM | POA: Diagnosis not present

## 2019-05-04 DIAGNOSIS — J438 Other emphysema: Secondary | ICD-10-CM | POA: Diagnosis not present

## 2019-05-04 DIAGNOSIS — I10 Essential (primary) hypertension: Secondary | ICD-10-CM | POA: Diagnosis not present

## 2019-05-04 DIAGNOSIS — G3184 Mild cognitive impairment, so stated: Secondary | ICD-10-CM | POA: Diagnosis not present

## 2019-05-04 DIAGNOSIS — E782 Mixed hyperlipidemia: Secondary | ICD-10-CM | POA: Diagnosis not present

## 2019-05-04 DIAGNOSIS — R079 Chest pain, unspecified: Secondary | ICD-10-CM | POA: Diagnosis not present

## 2019-05-04 DIAGNOSIS — N183 Chronic kidney disease, stage 3 (moderate): Secondary | ICD-10-CM | POA: Diagnosis not present

## 2019-05-09 ENCOUNTER — Telehealth: Payer: Self-pay | Admitting: Cardiology

## 2019-05-09 NOTE — Progress Notes (Signed)
Virtual Visit via Telephone Note   This visit type was conducted due to national recommendations for restrictions regarding the COVID-19 Pandemic (e.g. social distancing) in an effort to limit this patient's exposure and mitigate transmission in our community.  Due to her co-morbid illnesses, this patient is at least at moderate risk for complications without adequate follow up.  This format is felt to be most appropriate for this patient at this time.  The patient did not have access to video technology/had technical difficulties with video requiring transitioning to audio format only (telephone).  All issues noted in this document were discussed and addressed.  No physical exam could be performed with this format.  Please refer to the patient's chart for her  consent [facility].   Date:  05/10/2019   ID:  Dorothy GrahamJoyce Bryant, DOB 1941-03-26, MRN [id number]  Patient Location: Home Provider Location: Home  PCP:  Estanislado PandySasser, Paul W, MD  Cardiologist:  Rollene RotundaJames Jarica Plass, MD  Electrophysiologist:  None   Evaluation Performed:  Follow-Up Visit  Chief Complaint:  Chest pain  History of Present Illness:    Dorothy Bryant is a 78 y.o. female with  who presents for evaluation of chest pain.  she has a history of LVH, carotid stenosis and HTN.   She did have chest pain in 2012 and had a cardiac catheterization with normal coronaries. She's had a normal echocardiogram as well.  I last saw her in 2016.   She saw her primary care doctor the other day.  I was able to review these records.  She is been having some grabbing chest discomfort.  This happens sporadically.  It sounds like she is limited by back problems.  She has been told to use a walker.  She gets around and does some mild chores in the household.  She says if she tries to do something like vacuuming she might get this discomfort.  However, it sounds like she is more limited by back pain she might also get it if she is doing nothing.  She  says it is of discomfort in her chest.  There does not to be associated nausea vomiting or diaphoresis.  It seems to be moderate in intensity.  It comes and goes spontaneously and seems to last for several minutes.  She is not describing jaw discomfort or arm discomfort.  She is not describing palpitations, presyncope or syncope.  She is had no PND or orthopnea.  She does get short of breath with activities that this she thinks has been slightly progressive.  She is not describing however resting shortness of breath.   The patient does not have symptoms concerning for COVID-19 infection (fever, chills, cough, or new shortness of breath).    Past Medical History:  Diagnosis Date   Chest pain    Cardiac catheterization 7/12: Normal coronary arteries, EF 60-65%, question mid LVOT obstruction with a 10 mm mercury gradient;   echo 7/12: Mild LVH, EF 55-60%, grade 2 diastolic dysfunction, mild LAE, PAS P. 29   COPD (chronic obstructive pulmonary disease) (HCC)    GERD (gastroesophageal reflux disease)    Heart palpitations    Event monitor 8/12   Hypertension    LBBB (left bundle branch block)    Past Surgical History:  Procedure Laterality Date   ABDOMINAL HYSTERECTOMY     CARDIAC CATHETERIZATION  06/30/2011   Est. EF of 60-65% with normal coronary arteries --    CATARACT EXTRACTION     CERVICAL  SPINE SURGERY     CHOLECYSTECTOMY       Current Meds  Medication Sig   albuterol (PROVENTIL) (2.5 MG/3ML) 0.083% nebulizer solution Take 2.5 mg by nebulization 3 (three) times daily.    aspirin 81 MG tablet Take 81 mg by mouth daily.     citalopram (CELEXA) 10 MG tablet Take 10 mg by mouth daily.     EUTHYROX 25 MCG tablet Take 25 mcg by mouth daily.    hydrochlorothiazide (HYDRODIURIL) 12.5 MG tablet Take 12.5 mg by mouth daily.    HYDROcodone-acetaminophen (NORCO/VICODIN) 5-325 MG per tablet Take 1 tablet by mouth every 6 (six) hours as needed for moderate pain.   losartan  (COZAAR) 100 MG tablet Take 100 mg by mouth daily.    meloxicam (MOBIC) 7.5 MG tablet Take 7.5 mg by mouth daily.   metoprolol succinate (TOPROL-XL) 50 MG 24 hr tablet TAKE ONE TABLET BY MOUTH ONCE DAILY WITH OR IMMEDIATELY  FOLLOWING  A  MEAL   omeprazole (PRILOSEC) 20 MG capsule Take 20 mg by mouth 2 (two) times daily before a meal.   raloxifene (EVISTA) 60 MG tablet Take 60 mg by mouth daily.   simvastatin (ZOCOR) 10 MG tablet Take 10 mg by mouth every evening.    STIOLTO RESPIMAT 2.5-2.5 MCG/ACT AERS Inhale into the lungs daily.    vitamin B-12 (CYANOCOBALAMIN) 500 MCG tablet Take 500 mcg by mouth daily.     Allergies:   Tagamet [cimetidine] and Zantac [ranitidine hcl]   Social History   Tobacco Use   Smoking status: Former Smoker    Packs/day: 1.00    Years: 50.00    Pack years: 50.00    Types: Cigarettes    Last attempt to quit: 09/17/2008    Years since quitting: 10.6   Smokeless tobacco: Never Used  Substance Use Topics   Alcohol use: No   Drug use: No     Family Hx: The patient's family history includes Acute lymphoblastic leukemia in her maternal grandfather; Heart attack (age of onset: 39) in her father; Hypertension in her mother.  ROS:   Please see the history of present illness.    As stated in the HPI and negative for all other systems.   Prior CV studies:   The following studies were reviewed today: EKG, cath Labs: LDL 93, HDL 84 Office records, Dr. Quintin Alto   Labs/Other Tests and Data Reviewed:    EKG:  NSR rate 69, axis within normal limits, incomplete left bundle branch block, inferolateral T wave inversions not significantly changed from the EKG from 2018.  Recent Labs: No results found for requested labs within last 8760 hours.   Recent Lipid Panel No results found for: CHOL, TRIG, HDL, CHOLHDL, LDLCALC, LDLDIRECT  Wt Readings from Last 3 Encounters:  05/10/19 154 lb (69.9 kg)  10/28/17 142 lb (64.4 kg)  05/02/15 155 lb 9.6 oz  (70.6 kg)     Objective:    Vital Signs:  BP 115/60 Comment: R arm   Pulse 84    Ht 5' (1.524 m)    Wt 154 lb (69.9 kg)    BMI 30.08 kg/m    VITAL SIGNS:  reviewed  ASSESSMENT & PLAN:    CHEST PAIN: Her chest pain is somewhat atypical.  However, she does have cardiovascular risk factors.  Further testing is indicated.  She would be a walker now.  Therefore, she will have a [employer].  Further evaluation will be based on these results.  HTN:  Blood pressures well controlled.  She will continue the meds as listed.  DYSPNEA: She has had some previous LVH as described below.  I will check a BNP level.  I suspect her breathing is multifactorial.  CAROTID STENOSIS:   She had 40 - 59% left stenosis in 2016 and is overdue for follow up.    I will arrange this when she has her stress test.    LVH:   As above   Time:   Today, I have spent 30 minutes with the patient with telehealth technology discussing the above problems.     Medication Adjustments/Labs and Tests Ordered: Current medicines are reviewed at length with the patient today.  Concerns regarding medicines are outlined above.   Tests Ordered: No orders of the defined types were placed in this encounter.   Medication Changes: No orders of the defined types were placed in this encounter.   Disposition:  Follow up with me as needed  Signed, Rollene RotundaJames Lander Eslick, MD  05/10/2019 12:52 PM    [facility]

## 2019-05-09 NOTE — Telephone Encounter (Signed)
Virtual Visit Pre-Appointment Phone Call  "(Name), I am calling you today to discuss your upcoming appointment. We are currently trying to limit exposure to the virus that causes COVID-19 by seeing patients at home rather than in the office."  1. "What is the BEST phone number to call the day of the visit?" - include this in appointment notes  2. Do you have or have access to (through a family member/friend) a smartphone with video capability that we can use for your visit?" a. If yes - list this number in appt notes as cell (if different from BEST phone #) and list the appointment type as a VIDEO visit in appointment notes b. If no - list the appointment type as a PHONE visit in appointment notes  3. Confirm consent - "In the setting of the current Covid19 crisis, you are scheduled for a (phone or video) visit with your provider on (date) at (time).  Just as we do with many in-office visits, in order for you to participate in this visit, we must obtain consent.  If you'd like, I can send this to your mychart (if signed up) or email for you to review.  Otherwise, I can obtain your verbal consent now.  All virtual visits are billed to your insurance company just like a normal visit would be.  By agreeing to a virtual visit, we'd like you to understand that the technology does not allow for your provider to perform an examination, and thus may limit your provider's ability to fully assess your condition. If your provider identifies any concerns that need to be evaluated in person, we will make arrangements to do so.  Finally, though the technology is pretty good, we cannot assure that it will always work on either your or our end, and in the setting of a video visit, we may have to convert it to a phone-only visit.  In either situation, we cannot ensure that we have a secure connection.  Are you willing to proceed?" STAFF: Did the patient verbally acknowledge consent to telehealth visit? Document  YES/NO here: Yes  4. Advise patient to be prepared - "Two hours prior to your appointment, go ahead and check your blood pressure, pulse, oxygen saturation, and your weight (if you have the equipment to check those) and write them all down. When your visit starts, your provider will ask you for this information. If you have an Apple Watch or Kardia device, please plan to have heart rate information ready on the day of your appointment. Please have a pen and paper handy nearby the day of the visit as well."  5. Give patient instructions for MyChart download to smartphone OR Doximity/Doxy.me as below if video visit (depending on what platform provider is using)  6. Inform patient they will receive a phone call 15 minutes prior to their appointment time (may be from unknown caller ID) so they should be prepared to answer    TELEPHONE CALL NOTE  Dorothy Bryant has been deemed a candidate for a follow-up tele-health visit to limit community exposure during the Covid-19 pandemic. I spoke with the patient via phone to ensure availability of phone/video source, confirm preferred email & phone number, and discuss instructions and expectations.  I reminded Dorothy Bryant to be prepared with any vital sign and/or heart rhythm information that could potentially be obtained via home monitoring, at the time of her visit. I reminded Dorothy Bryant to expect a phone call prior to her visit.  [name] 05/09/2019 12:16 PM   INSTRUCTIONS FOR DOWNLOADING THE MYCHART APP TO SMARTPHONE  - The patient must first make sure to have activated MyChart and know their login information - If Apple, go to [employer] and type in MyChart in the search bar and download the app. If Android, ask patient to go to [employer] and type in [place] in the search bar and download the app. The app is free but as with any other app downloads, their phone may require them to verify saved payment information or Apple/Android password.  -  The patient will need to then log into the app with their MyChart username and password, and select [facility] as their healthcare provider to link the account. When it is time for your visit, go to the MyChart app, find appointments, and click Begin Video Visit. Be sure to Select Allow for your device to access the Microphone and Camera for your visit. You will then be connected, and your provider will be with you shortly.  **If they have any issues connecting, or need assistance please contact MyChart service desk [phone])**  **If using a computer, in order to ensure the best quality for their visit they will need to use either of the following Internet Browsers: [employer], or Google Chrome**  IF USING DOXIMITY or DOXY.ME - The patient will receive a link just prior to their visit by text.     FULL LENGTH CONSENT FOR TELE-HEALTH VISIT   I hereby voluntarily request, consent and authorize [facility] and its employed or contracted physicians, physician assistants, nurse practitioners or other licensed health care professionals (the Practitioner), to provide me with telemedicine health care services (the Services") as deemed necessary by the treating Practitioner. I acknowledge and consent to receive the Services by the Practitioner via telemedicine. I understand that the telemedicine visit will involve communicating with the Practitioner through live audiovisual communication technology and the disclosure of certain medical information by electronic transmission. I acknowledge that I have been given the opportunity to request an in-person assessment or other available alternative prior to the telemedicine visit and am voluntarily participating in the telemedicine visit.  I understand that I have the right to withhold or withdraw my consent to the use of telemedicine in the course of my care at any time, without affecting my right to future care or treatment, and that the  Practitioner or I may terminate the telemedicine visit at any time. I understand that I have the right to inspect all information obtained and/or recorded in the course of the telemedicine visit and may receive copies of available information for a reasonable fee.  I understand that some of the potential risks of receiving the Services via telemedicine include:   Delay or interruption in medical evaluation due to technological equipment failure or disruption;  Information transmitted may not be sufficient (e.g. poor resolution of images) to allow for appropriate medical decision making by the Practitioner; and/or   In rare instances, security protocols could fail, causing a breach of personal health information.  Furthermore, I acknowledge that it is my responsibility to provide information about my medical history, conditions and care that is complete and accurate to the best of my ability. I acknowledge that Practitioner's advice, recommendations, and/or decision may be based on factors not within their control, such as incomplete or inaccurate data provided by me or distortions of diagnostic images or specimens that may result from electronic transmissions. I understand that the  practice of medicine is not an Chief Strategy Officer and that Practitioner makes no warranties or guarantees regarding treatment outcomes. I acknowledge that I will receive a copy of this consent concurrently upon execution via email to the email address I last provided but may also request a printed copy by calling the office of [facility].    I understand that my insurance will be billed for this visit.   I have read or had this consent read to me.  I understand the contents of this consent, which adequately explains the benefits and risks of the Services being provided via telemedicine.   I have been provided ample opportunity to ask questions regarding this consent and the Services and have had my questions answered to my  satisfaction.  I give my informed consent for the services to be provided through the use of telemedicine in my medical care  By participating in this telemedicine visit I agree to the above.

## 2019-05-10 ENCOUNTER — Encounter: Payer: Self-pay | Admitting: Cardiology

## 2019-05-10 ENCOUNTER — Other Ambulatory Visit: Payer: Self-pay

## 2019-05-10 ENCOUNTER — Telehealth (INDEPENDENT_AMBULATORY_CARE_PROVIDER_SITE_OTHER): Payer: Medicare Other | Admitting: Cardiology

## 2019-05-10 ENCOUNTER — Telehealth: Payer: Self-pay | Admitting: *Deleted

## 2019-05-10 VITALS — BP 115/60 | HR 84 | Ht 60.0 in | Wt 154.0 lb

## 2019-05-10 DIAGNOSIS — R0602 Shortness of breath: Secondary | ICD-10-CM

## 2019-05-10 DIAGNOSIS — R079 Chest pain, unspecified: Secondary | ICD-10-CM

## 2019-05-10 DIAGNOSIS — I6523 Occlusion and stenosis of bilateral carotid arteries: Secondary | ICD-10-CM | POA: Diagnosis not present

## 2019-05-10 DIAGNOSIS — R9431 Abnormal electrocardiogram [ECG] [EKG]: Secondary | ICD-10-CM

## 2019-05-10 DIAGNOSIS — I10 Essential (primary) hypertension: Secondary | ICD-10-CM

## 2019-05-10 DIAGNOSIS — Z7189 Other specified counseling: Secondary | ICD-10-CM

## 2019-05-10 NOTE — Patient Instructions (Addendum)
Medication Instructions:  The current medical regimen is effective;  continue present plan and medications.  If you need a refill on your cardiac medications before your next appointment, please call your pharmacy.   Lab work: You will need have blood work drawn (BNP)  Can be drawn at [facility] when there for testing. If you have labs (blood work) drawn today and your tests are completely normal, you will receive your results only by: Marland Kitchen MyChart Message (if you have MyChart) OR . A paper copy in the mail If you have any lab test that is abnormal or we need to change your treatment, we will call you to review the results.  Testing/Procedures: Your physician has requested that you have a lexiscan myoview. Please report through Day Surgery at [facility] and proceed to Radiology.  Nothing to eat/drink 4 hours before before the testing and no caffiene after midnight the night before.  This is scheduled for Monday, June 15th at 8:30 am.  Please call [phone] to reschedule.  Your physician has requested that you have a carotid duplex. This test is an ultrasound of the carotid arteries in your neck. It looks at blood flow through these arteries that supply the brain with blood. Allow one hour for this exam. There are no restrictions or special instructions.  This test is scheduled for Wednesday June 17th at 1:30 pm.  Please arrive 15 minutes prior to your appointment.   Both of the above testing will be completed at [facility]: Follow up as needed after the above testing.   Thank you for choosing [facility]!!

## 2019-05-10 NOTE — Telephone Encounter (Signed)
Called patient and reviewed date, time and instructions for Lexiscan scheduled for Monday at [facility].  Also reviewed date and time for carotid doppler on Wednesday.  Pt states understanding but will c/b if questions.  Please see pt's AVS for further details if needed.

## 2019-05-15 ENCOUNTER — Other Ambulatory Visit: Payer: Self-pay

## 2019-05-15 ENCOUNTER — Encounter (HOSPITAL_COMMUNITY)
Admission: RE | Admit: 2019-05-15 | Discharge: 2019-05-15 | Disposition: A | Discharge status: Home / Self Care | Payer: Medicare Other | Source: Ambulatory Visit | Attending: Cardiology | Admitting: Cardiology

## 2019-05-15 ENCOUNTER — Other Ambulatory Visit (HOSPITAL_COMMUNITY)
Admission: RE | Admit: 2019-05-15 | Discharge: 2019-05-15 | Disposition: A | Discharge status: Home / Self Care | Payer: Medicare Other | Source: Ambulatory Visit | Attending: Cardiology | Admitting: Cardiology

## 2019-05-15 ENCOUNTER — Encounter (HOSPITAL_BASED_OUTPATIENT_CLINIC_OR_DEPARTMENT_OTHER)
Admission: RE | Admit: 2019-05-15 | Discharge: 2019-05-15 | Disposition: A | Discharge status: Home / Self Care | Payer: Medicare Other | Source: Ambulatory Visit | Attending: Cardiology | Admitting: Cardiology

## 2019-05-15 DIAGNOSIS — R0789 Other chest pain: Secondary | ICD-10-CM

## 2019-05-15 DIAGNOSIS — R9431 Abnormal electrocardiogram [ECG] [EKG]: Secondary | ICD-10-CM | POA: Diagnosis not present

## 2019-05-15 DIAGNOSIS — R079 Chest pain, unspecified: Secondary | ICD-10-CM

## 2019-05-15 DIAGNOSIS — R0602 Shortness of breath: Secondary | ICD-10-CM | POA: Insufficient documentation

## 2019-05-15 LAB — BRAIN NATRIURETIC PEPTIDE: B Natriuretic Peptide: 123 pg/mL — ABNORMAL HIGH (ref 0.0–100.0)

## 2019-05-15 MED ORDER — REGADENOSON 0.4 MG/5ML IV SOLN
INTRAVENOUS | Status: AC
  Administered 2019-05-15: 0.4 mg via INTRAVENOUS
  Filled 2019-05-15: qty 5

## 2019-05-15 MED ORDER — TECHNETIUM TC 99M TETROFOSMIN IV KIT
30.0000 | PACK | Freq: Once | INTRAVENOUS | Status: AC
  Administered 2019-05-15: 30 via INTRAVENOUS

## 2019-05-15 MED ORDER — SODIUM CHLORIDE 0.9% FLUSH
INTRAVENOUS | Status: AC
  Administered 2019-05-15: 10 mL via INTRAVENOUS
  Filled 2019-05-15: qty 10

## 2019-05-15 MED ORDER — TECHNETIUM TC 99M TETROFOSMIN IV KIT
10.0000 | PACK | Freq: Once | INTRAVENOUS | Status: AC | PRN
  Administered 2019-05-15: 09:00:00 10.6 via INTRAVENOUS

## 2019-05-16 LAB — NM MYOCAR MULTI W/SPECT W/WALL MOTION / EF
LV dias vol: 46 mL (ref 46–106)
LV sys vol: 16 mL
Peak HR: 99 {beats}/min
RATE: 0.59
Rest HR: 69 {beats}/min
SDS: 3
SRS: 6
SSS: 9
TID: 1.01

## 2019-05-17 ENCOUNTER — Ambulatory Visit (HOSPITAL_COMMUNITY)
Admission: RE | Admit: 2019-05-17 | Discharge: 2019-05-17 | Disposition: A | Discharge status: Home / Self Care | Payer: Medicare Other | Source: Ambulatory Visit | Attending: Cardiology | Admitting: Cardiology

## 2019-05-17 ENCOUNTER — Other Ambulatory Visit: Payer: Self-pay

## 2019-05-17 DIAGNOSIS — I6523 Occlusion and stenosis of bilateral carotid arteries: Secondary | ICD-10-CM | POA: Insufficient documentation

## 2019-05-17 DIAGNOSIS — I6522 Occlusion and stenosis of left carotid artery: Secondary | ICD-10-CM | POA: Diagnosis not present

## 2019-05-19 ENCOUNTER — Telehealth: Payer: Self-pay | Admitting: *Deleted

## 2019-05-19 DIAGNOSIS — I10 Essential (primary) hypertension: Secondary | ICD-10-CM

## 2019-05-19 DIAGNOSIS — I6523 Occlusion and stenosis of bilateral carotid arteries: Secondary | ICD-10-CM

## 2019-05-19 NOTE — Telephone Encounter (Signed)
-----   Message from Minus Breeding, MD sent at 05/18/2019  1:29 PM EDT ----- Low risk study.  No change in therapy.  Call Ms. Cockrell with the results and send results to Manon Hilding, MD.  No evidence of CAD.

## 2019-05-19 NOTE — Telephone Encounter (Signed)
Advised patient of labs, stress test, and carotid doppler.  Orders placed for echo, [place] one year and message sent to schedulers to arrange appointments

## 2019-05-19 NOTE — Telephone Encounter (Signed)
-----   Message from Minus Breeding, MD sent at 05/18/2019  1:33 PM EDT ----- This is a very mildly elevated.  I would like for her to have an echocardiogram and then follow up with me.  This could be a virtual visit.

## 2019-05-19 NOTE — Telephone Encounter (Signed)
-----   Message from Minus Breeding, MD sent at 05/18/2019  1:34 PM EDT ----- Follow up in one year.   Left ICA stenosis estimated at 50-69% Right ICA narrowing less than 50% Call Ms. Mena with the results and send results to Manon Hilding, MD

## 2019-06-01 ENCOUNTER — Ambulatory Visit (HOSPITAL_COMMUNITY): Payer: Medicare Other | Attending: Cardiology

## 2019-06-01 ENCOUNTER — Other Ambulatory Visit: Payer: Self-pay

## 2019-06-01 DIAGNOSIS — I10 Essential (primary) hypertension: Secondary | ICD-10-CM | POA: Insufficient documentation

## 2019-06-01 DIAGNOSIS — I6523 Occlusion and stenosis of bilateral carotid arteries: Secondary | ICD-10-CM | POA: Insufficient documentation

## 2019-07-25 DIAGNOSIS — H43393 Other vitreous opacities, bilateral: Secondary | ICD-10-CM | POA: Diagnosis not present

## 2019-08-03 DIAGNOSIS — E782 Mixed hyperlipidemia: Secondary | ICD-10-CM | POA: Diagnosis not present

## 2019-08-03 DIAGNOSIS — R739 Hyperglycemia, unspecified: Secondary | ICD-10-CM | POA: Diagnosis not present

## 2019-08-03 DIAGNOSIS — I10 Essential (primary) hypertension: Secondary | ICD-10-CM | POA: Diagnosis not present

## 2019-08-03 DIAGNOSIS — E7800 Pure hypercholesterolemia, unspecified: Secondary | ICD-10-CM | POA: Diagnosis not present

## 2019-08-03 DIAGNOSIS — E781 Pure hyperglyceridemia: Secondary | ICD-10-CM | POA: Diagnosis not present

## 2019-08-16 DIAGNOSIS — G252 Other specified forms of tremor: Secondary | ICD-10-CM | POA: Diagnosis not present

## 2019-08-16 DIAGNOSIS — G3184 Mild cognitive impairment, so stated: Secondary | ICD-10-CM | POA: Diagnosis not present

## 2019-08-16 DIAGNOSIS — K210 Gastro-esophageal reflux disease with esophagitis: Secondary | ICD-10-CM | POA: Diagnosis not present

## 2019-08-16 DIAGNOSIS — M545 Low back pain: Secondary | ICD-10-CM | POA: Diagnosis not present

## 2019-08-16 DIAGNOSIS — I10 Essential (primary) hypertension: Secondary | ICD-10-CM | POA: Diagnosis not present

## 2019-08-16 DIAGNOSIS — J438 Other emphysema: Secondary | ICD-10-CM | POA: Diagnosis not present

## 2019-08-16 DIAGNOSIS — Z23 Encounter for immunization: Secondary | ICD-10-CM | POA: Diagnosis not present

## 2019-08-16 DIAGNOSIS — N183 Chronic kidney disease, stage 3 (moderate): Secondary | ICD-10-CM | POA: Diagnosis not present

## 2019-09-29 DIAGNOSIS — E782 Mixed hyperlipidemia: Secondary | ICD-10-CM | POA: Diagnosis not present

## 2019-09-29 DIAGNOSIS — I10 Essential (primary) hypertension: Secondary | ICD-10-CM | POA: Diagnosis not present

## 2019-10-02 DIAGNOSIS — U071 COVID-19: Secondary | ICD-10-CM | POA: Diagnosis not present

## 2019-12-13 DIAGNOSIS — E7800 Pure hypercholesterolemia, unspecified: Secondary | ICD-10-CM | POA: Diagnosis not present

## 2019-12-13 DIAGNOSIS — I10 Essential (primary) hypertension: Secondary | ICD-10-CM | POA: Diagnosis not present

## 2019-12-13 DIAGNOSIS — N183 Chronic kidney disease, stage 3 unspecified: Secondary | ICD-10-CM | POA: Diagnosis not present

## 2019-12-13 DIAGNOSIS — K2100 Gastro-esophageal reflux disease with esophagitis, without bleeding: Secondary | ICD-10-CM | POA: Diagnosis not present

## 2019-12-13 DIAGNOSIS — E782 Mixed hyperlipidemia: Secondary | ICD-10-CM | POA: Diagnosis not present

## 2019-12-18 DIAGNOSIS — J438 Other emphysema: Secondary | ICD-10-CM | POA: Diagnosis not present

## 2019-12-18 DIAGNOSIS — I10 Essential (primary) hypertension: Secondary | ICD-10-CM | POA: Diagnosis not present

## 2019-12-18 DIAGNOSIS — R4582 Worries: Secondary | ICD-10-CM | POA: Diagnosis not present

## 2019-12-18 DIAGNOSIS — Z1389 Encounter for screening for other disorder: Secondary | ICD-10-CM | POA: Diagnosis not present

## 2019-12-29 DIAGNOSIS — E7849 Other hyperlipidemia: Secondary | ICD-10-CM | POA: Diagnosis not present

## 2019-12-29 DIAGNOSIS — I10 Essential (primary) hypertension: Secondary | ICD-10-CM | POA: Diagnosis not present

## 2020-04-12 DIAGNOSIS — E7800 Pure hypercholesterolemia, unspecified: Secondary | ICD-10-CM | POA: Diagnosis not present

## 2020-04-12 DIAGNOSIS — J441 Chronic obstructive pulmonary disease with (acute) exacerbation: Secondary | ICD-10-CM | POA: Diagnosis not present

## 2020-04-12 DIAGNOSIS — I10 Essential (primary) hypertension: Secondary | ICD-10-CM | POA: Diagnosis not present

## 2020-04-12 DIAGNOSIS — K2100 Gastro-esophageal reflux disease with esophagitis, without bleeding: Secondary | ICD-10-CM | POA: Diagnosis not present

## 2020-04-12 DIAGNOSIS — N183 Chronic kidney disease, stage 3 unspecified: Secondary | ICD-10-CM | POA: Diagnosis not present

## 2020-04-18 DIAGNOSIS — I10 Essential (primary) hypertension: Secondary | ICD-10-CM | POA: Diagnosis not present

## 2020-04-18 DIAGNOSIS — E782 Mixed hyperlipidemia: Secondary | ICD-10-CM | POA: Diagnosis not present

## 2020-04-18 DIAGNOSIS — R7301 Impaired fasting glucose: Secondary | ICD-10-CM | POA: Diagnosis not present

## 2020-04-18 DIAGNOSIS — J438 Other emphysema: Secondary | ICD-10-CM | POA: Diagnosis not present

## 2020-04-18 DIAGNOSIS — R4582 Worries: Secondary | ICD-10-CM | POA: Diagnosis not present

## 2020-05-16 ENCOUNTER — Ambulatory Visit (HOSPITAL_COMMUNITY)
Admission: RE | Admit: 2020-05-16 | Discharge: 2020-05-16 | Disposition: A | Discharge status: Home / Self Care | Payer: Medicare Other | Source: Ambulatory Visit | Attending: Cardiology | Admitting: Cardiology

## 2020-05-16 ENCOUNTER — Other Ambulatory Visit: Payer: Self-pay

## 2020-05-16 DIAGNOSIS — I6523 Occlusion and stenosis of bilateral carotid arteries: Secondary | ICD-10-CM

## 2020-05-16 DIAGNOSIS — I10 Essential (primary) hypertension: Secondary | ICD-10-CM | POA: Diagnosis not present

## 2020-08-13 DIAGNOSIS — M542 Cervicalgia: Secondary | ICD-10-CM | POA: Diagnosis not present

## 2020-08-13 DIAGNOSIS — M25532 Pain in left wrist: Secondary | ICD-10-CM | POA: Diagnosis not present

## 2020-08-13 DIAGNOSIS — M545 Low back pain: Secondary | ICD-10-CM | POA: Diagnosis not present

## 2020-08-14 DIAGNOSIS — M419 Scoliosis, unspecified: Secondary | ICD-10-CM | POA: Diagnosis not present

## 2020-08-14 DIAGNOSIS — M545 Low back pain: Secondary | ICD-10-CM | POA: Diagnosis not present

## 2020-08-14 DIAGNOSIS — S299XXA Unspecified injury of thorax, initial encounter: Secondary | ICD-10-CM | POA: Diagnosis not present

## 2020-08-14 DIAGNOSIS — M792 Neuralgia and neuritis, unspecified: Secondary | ICD-10-CM | POA: Diagnosis not present

## 2020-08-14 DIAGNOSIS — M546 Pain in thoracic spine: Secondary | ICD-10-CM | POA: Diagnosis not present

## 2020-08-14 DIAGNOSIS — M549 Dorsalgia, unspecified: Secondary | ICD-10-CM | POA: Diagnosis not present

## 2020-08-14 DIAGNOSIS — M25532 Pain in left wrist: Secondary | ICD-10-CM | POA: Diagnosis not present

## 2020-08-14 DIAGNOSIS — S6992XA Unspecified injury of left wrist, hand and finger(s), initial encounter: Secondary | ICD-10-CM | POA: Diagnosis not present

## 2020-08-14 DIAGNOSIS — I700 Atherosclerosis of aorta: Secondary | ICD-10-CM | POA: Diagnosis not present

## 2020-08-14 DIAGNOSIS — M542 Cervicalgia: Secondary | ICD-10-CM | POA: Diagnosis not present

## 2020-08-15 DIAGNOSIS — N183 Chronic kidney disease, stage 3 unspecified: Secondary | ICD-10-CM | POA: Diagnosis not present

## 2020-08-15 DIAGNOSIS — K2100 Gastro-esophageal reflux disease with esophagitis, without bleeding: Secondary | ICD-10-CM | POA: Diagnosis not present

## 2020-08-15 DIAGNOSIS — R7301 Impaired fasting glucose: Secondary | ICD-10-CM | POA: Diagnosis not present

## 2020-08-15 DIAGNOSIS — J441 Chronic obstructive pulmonary disease with (acute) exacerbation: Secondary | ICD-10-CM | POA: Diagnosis not present

## 2020-08-15 DIAGNOSIS — I10 Essential (primary) hypertension: Secondary | ICD-10-CM | POA: Diagnosis not present

## 2020-08-19 DIAGNOSIS — M810 Age-related osteoporosis without current pathological fracture: Secondary | ICD-10-CM | POA: Diagnosis not present

## 2020-08-19 DIAGNOSIS — J438 Other emphysema: Secondary | ICD-10-CM | POA: Diagnosis not present

## 2020-08-19 DIAGNOSIS — E782 Mixed hyperlipidemia: Secondary | ICD-10-CM | POA: Diagnosis not present

## 2020-08-19 DIAGNOSIS — R4582 Worries: Secondary | ICD-10-CM | POA: Diagnosis not present

## 2020-08-19 DIAGNOSIS — I10 Essential (primary) hypertension: Secondary | ICD-10-CM | POA: Diagnosis not present

## 2020-08-25 DIAGNOSIS — R918 Other nonspecific abnormal finding of lung field: Secondary | ICD-10-CM | POA: Diagnosis not present

## 2020-08-25 DIAGNOSIS — J449 Chronic obstructive pulmonary disease, unspecified: Secondary | ICD-10-CM | POA: Diagnosis not present

## 2020-08-25 DIAGNOSIS — J441 Chronic obstructive pulmonary disease with (acute) exacerbation: Secondary | ICD-10-CM | POA: Diagnosis not present

## 2020-08-25 DIAGNOSIS — R079 Chest pain, unspecified: Secondary | ICD-10-CM | POA: Diagnosis not present

## 2020-08-25 DIAGNOSIS — Z79899 Other long term (current) drug therapy: Secondary | ICD-10-CM | POA: Diagnosis not present

## 2020-08-25 DIAGNOSIS — I454 Nonspecific intraventricular block: Secondary | ICD-10-CM | POA: Diagnosis not present

## 2020-08-25 DIAGNOSIS — I10 Essential (primary) hypertension: Secondary | ICD-10-CM | POA: Diagnosis not present

## 2020-09-04 DIAGNOSIS — Z23 Encounter for immunization: Secondary | ICD-10-CM | POA: Diagnosis not present

## 2020-09-04 DIAGNOSIS — J441 Chronic obstructive pulmonary disease with (acute) exacerbation: Secondary | ICD-10-CM | POA: Diagnosis not present

## 2020-10-13 IMAGING — US BILATERAL CAROTID DUPLEX ULTRASOUND
1 series · 13 of 24 positions shown · non-contrast
Comparison: None available

CLINICAL DATA: Carotid atherosclerosis

EXAM:
BILATERAL CAROTID DUPLEX ULTRASOUND
TECHNIQUE: Gray scale imaging, color Doppler and duplex ultrasound were
performed of bilateral carotid and vertebral arteries in the neck.

[Series 1: bilateral carotid duplex ultrasound · 0.06mm/px · 13 of 103 slices shown]
[im 1/103]
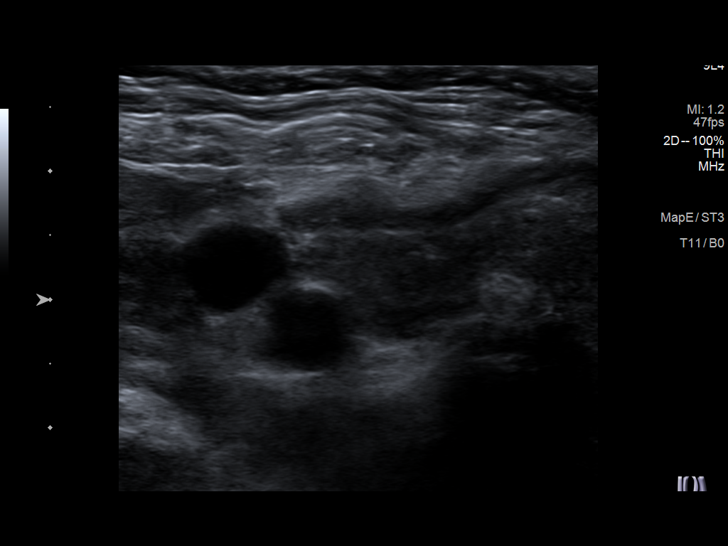
[im 9/103]
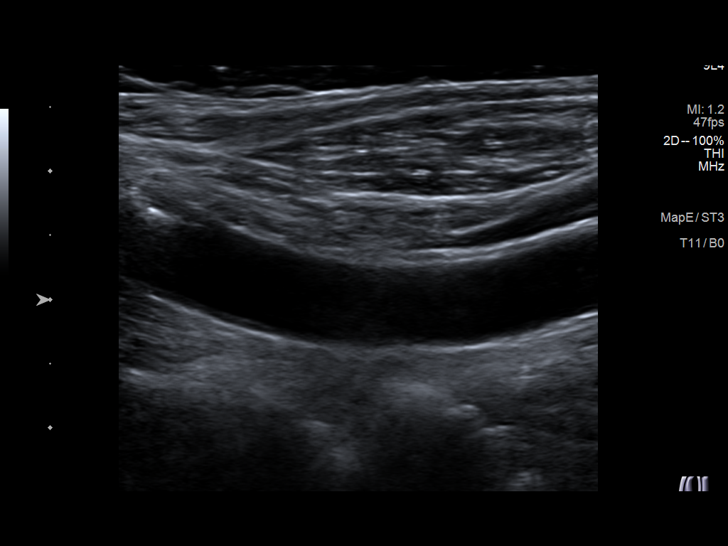
[im 18/103]
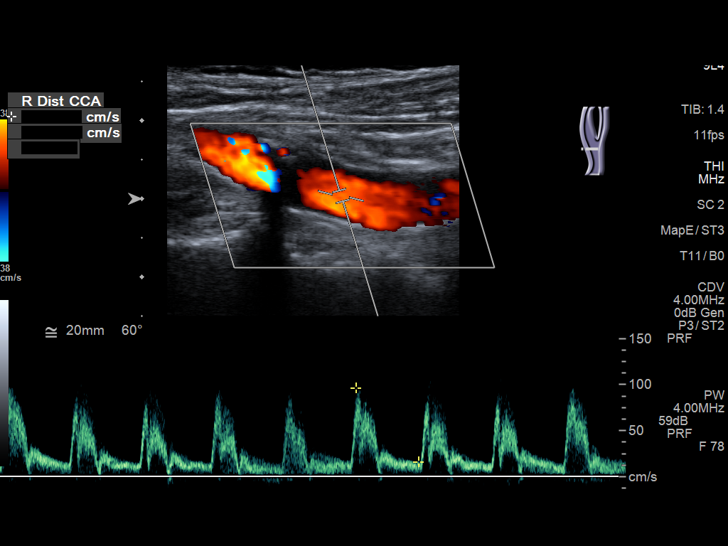
[im 27/103]
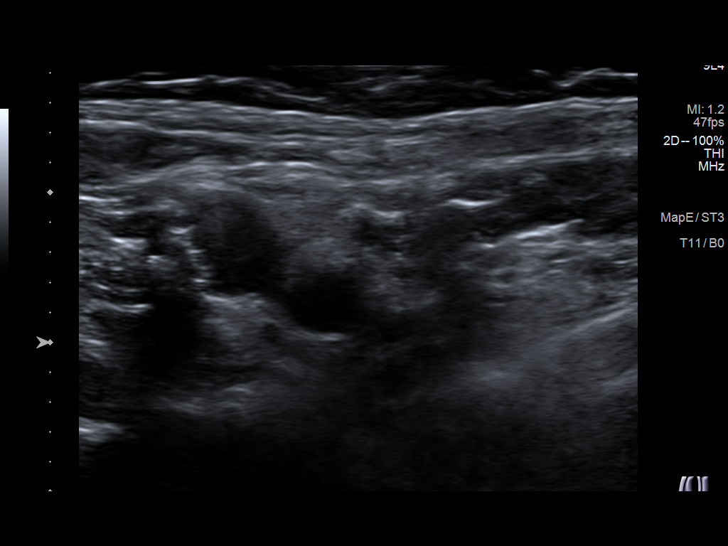
[im 36/103]
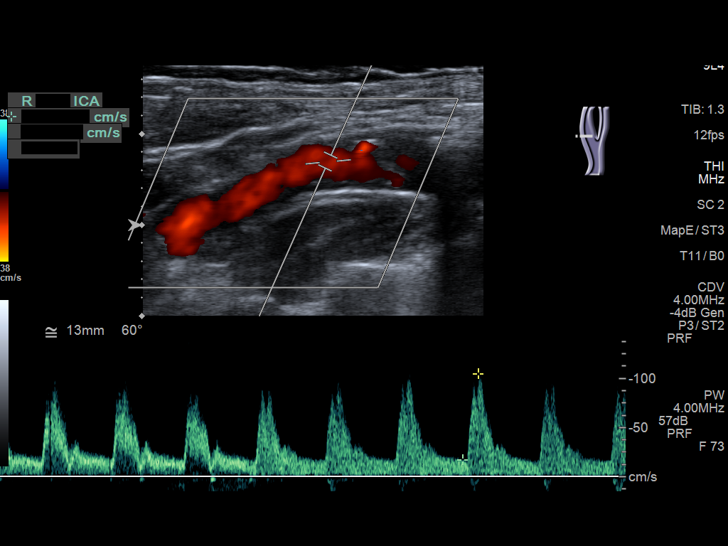
[im 45/103]
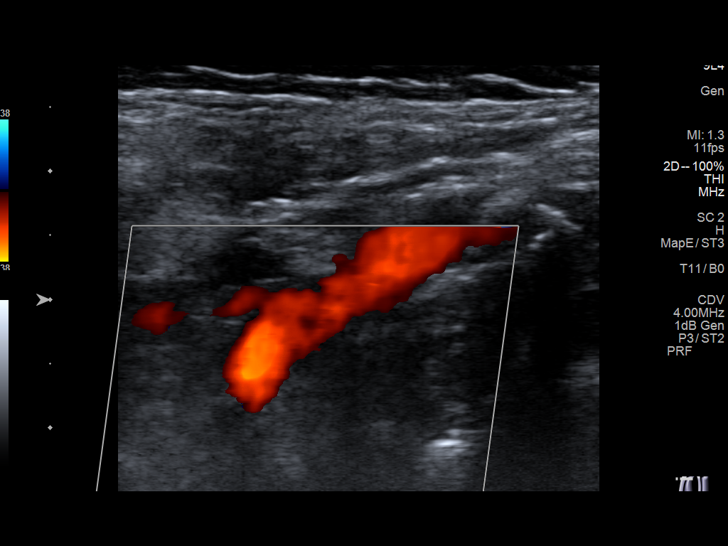
[im 54/103]
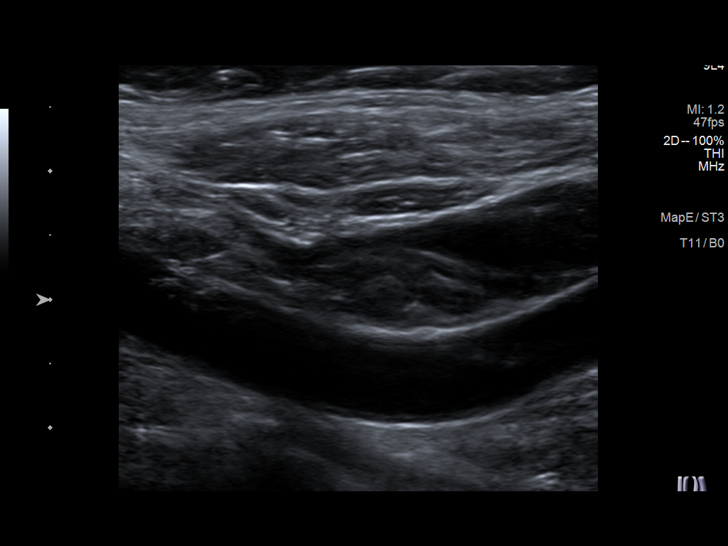
[im 58/103]
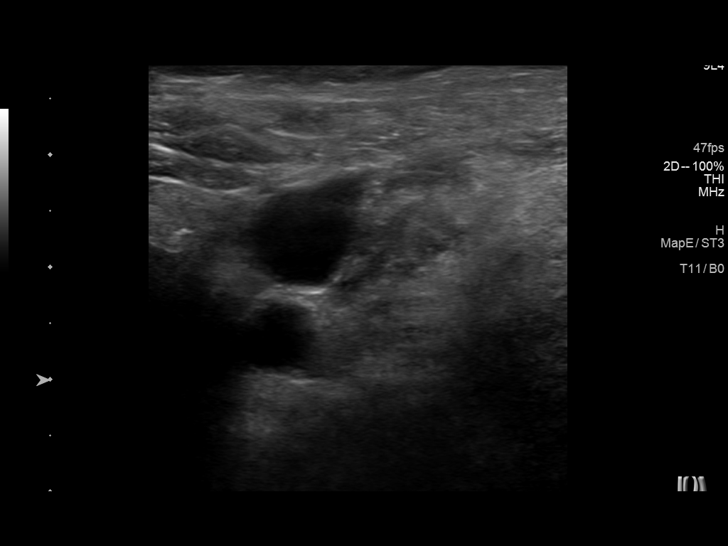
[im 67/103]
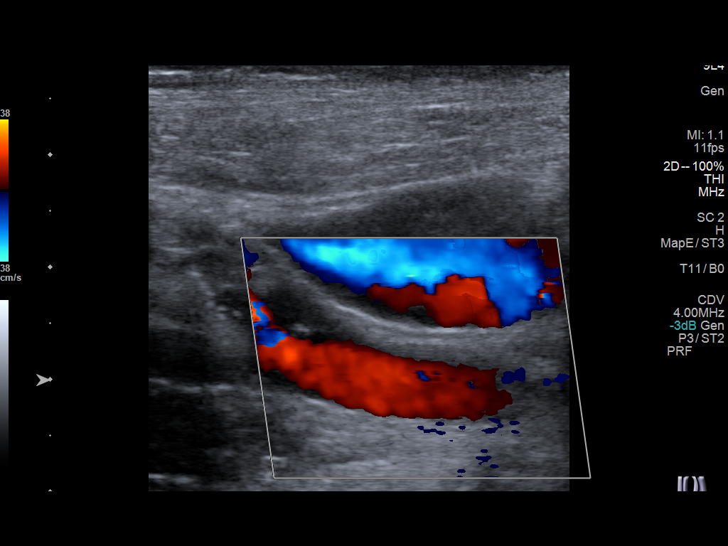
[im 76/103]
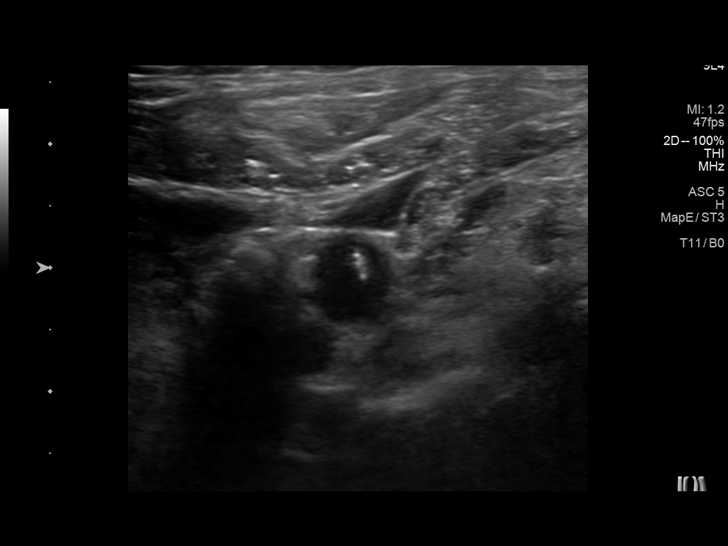
[im 85/103]
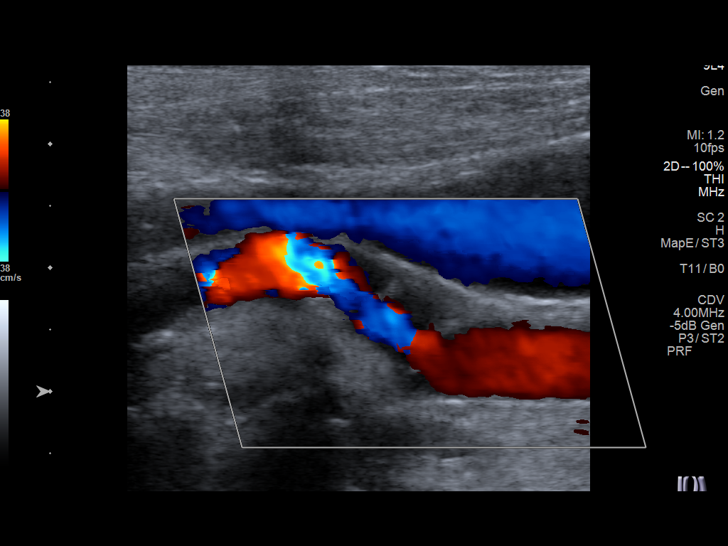
[im 94/103]
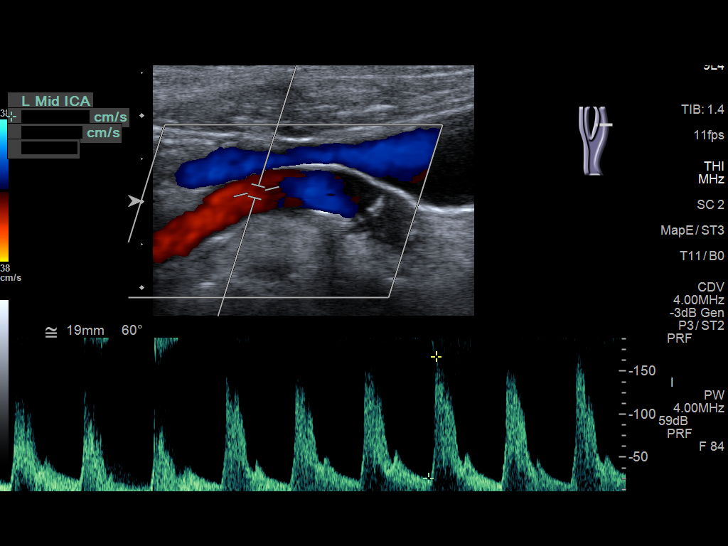
[im 103/103]
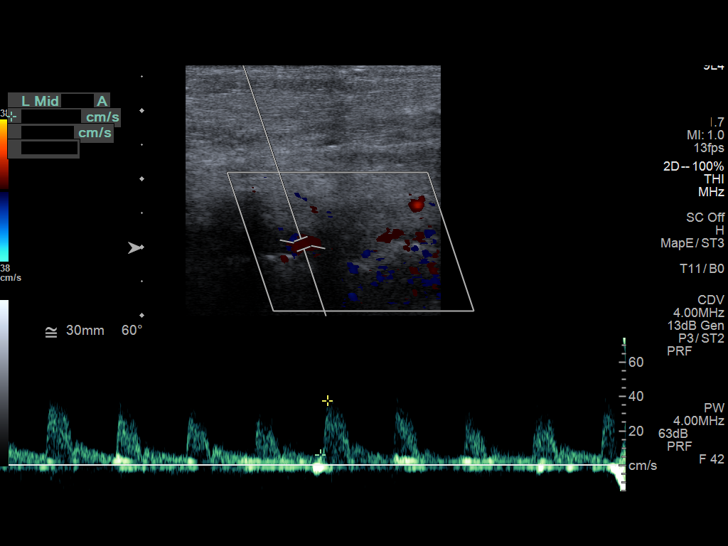

[13 of 24 positions shown; findings below may reference images not displayed]

FINDINGS: Criteria: Quantification of carotid stenosis is based on velocity
parameters that correlate the residual internal carotid diameter
with NASCET-based stenosis levels, using the diameter of the distal
internal carotid lumen as the denominator for stenosis measurement.

The following velocity measurements were obtained:

RIGHT

ICA: 132/25 cm/sec

CCA: 109/15 cm/sec

SYSTOLIC ICA/CCA RATIO:

ECA: 144 cm/sec

LEFT

ICA: 179/30 cm/sec

CCA: 105/14 cm/sec

SYSTOLIC ICA/CCA RATIO:

ECA: 119 cm/sec

RIGHT CAROTID ARTERY: Moderate heterogeneous calcified right carotid
bifurcation atherosclerosis. No hemodynamically significant right
ICA stenosis, velocity elevation, or turbulent flow. Degree of
narrowing estimated at less than 50%.

RIGHT VERTEBRAL ARTERY:  Antegrade

LEFT CAROTID ARTERY: Moderate to slightly more severe left carotid
bifurcation atherosclerosis. There is luminal narrowing of the
proximal ICA by grayscale imaging. In this region there is turbulent
flow with slight velocity elevation measuring 179/30
centimeters/second. Degree of stenosis estimated at 50-69% by
ultrasound criteria.

LEFT VERTEBRAL ARTERY:  Antegrade
IMPRESSION: Bilateral carotid atherosclerosis worse on the left.

Left ICA stenosis estimated at 50-69%

Right ICA narrowing less than 50%

Patent antegrade vertebral flow bilaterally

## 2020-12-31 DEATH — deceased
